# Patient Record
Sex: Male | Born: 1950
Health system: Southern US, Community
[De-identification: ages and names within clinical notes are randomized; demographics above are authoritative.]

## PROBLEM LIST (undated history)

## (undated) DIAGNOSIS — K4090 Unilateral inguinal hernia, without obstruction or gangrene, not specified as recurrent: Secondary | ICD-10-CM

## (undated) DIAGNOSIS — D213 Benign neoplasm of connective and other soft tissue of thorax: Secondary | ICD-10-CM

## (undated) DIAGNOSIS — I10 Essential (primary) hypertension: Secondary | ICD-10-CM

## (undated) DIAGNOSIS — L02219 Cutaneous abscess of trunk, unspecified: Secondary | ICD-10-CM

## (undated) DIAGNOSIS — K469 Unspecified abdominal hernia without obstruction or gangrene: Secondary | ICD-10-CM

## (undated) DIAGNOSIS — M109 Gout, unspecified: Secondary | ICD-10-CM

## (undated) DIAGNOSIS — C493 Malignant neoplasm of connective and soft tissue of thorax: Secondary | ICD-10-CM

## (undated) DIAGNOSIS — Z87891 Personal history of nicotine dependence: Secondary | ICD-10-CM

## (undated) DIAGNOSIS — Z1211 Encounter for screening for malignant neoplasm of colon: Secondary | ICD-10-CM

## (undated) DIAGNOSIS — I2510 Atherosclerotic heart disease of native coronary artery without angina pectoris: Secondary | ICD-10-CM

## (undated) DIAGNOSIS — L03319 Cellulitis of trunk, unspecified: Secondary | ICD-10-CM

## (undated) DIAGNOSIS — I499 Cardiac arrhythmia, unspecified: Secondary | ICD-10-CM

## (undated) DIAGNOSIS — H269 Unspecified cataract: Secondary | ICD-10-CM

## (undated) DIAGNOSIS — H9190 Unspecified hearing loss, unspecified ear: Secondary | ICD-10-CM

## (undated) DIAGNOSIS — H35342 Macular cyst, hole, or pseudohole, left eye: Secondary | ICD-10-CM

## (undated) DIAGNOSIS — D485 Neoplasm of uncertain behavior of skin: Secondary | ICD-10-CM

## (undated) DIAGNOSIS — C61 Malignant neoplasm of prostate: Secondary | ICD-10-CM

## (undated) DIAGNOSIS — S2239XA Fracture of one rib, unspecified side, initial encounter for closed fracture: Secondary | ICD-10-CM

## (undated) HISTORY — DX: Benign neoplasm of connective and other soft tissue of thorax: D21.3

## (undated) HISTORY — PX: LIPOMA EXCISION: SHX5283

## (undated) HISTORY — DX: Personal history of nicotine dependence: Z87.891

## (undated) HISTORY — DX: Encounter for screening for malignant neoplasm of colon: Z12.11

## (undated) HISTORY — DX: Fracture of one rib, unspecified side, initial encounter for closed fracture: S22.39XA

## (undated) HISTORY — DX: Essential (primary) hypertension: I10

## (undated) HISTORY — DX: Neoplasm of uncertain behavior of skin: D48.5

## (undated) HISTORY — DX: Gout, unspecified: M10.9

## (undated) HISTORY — DX: Unspecified cataract: H26.9

## (undated) HISTORY — DX: Malignant neoplasm of connective and soft tissue of thorax: C49.3

## (undated) HISTORY — DX: Cellulitis of trunk, unspecified: L03.319

## (undated) HISTORY — DX: Unspecified abdominal hernia without obstruction or gangrene: K46.9

## (undated) HISTORY — DX: Cutaneous abscess of trunk, unspecified: L02.219

## (undated) HISTORY — PX: EYE SURGERY: SHX253

## (undated) HISTORY — DX: Malignant neoplasm of prostate: C61

## (undated) HISTORY — PX: COLONOSCOPY: SHX174

## (undated) HISTORY — DX: Macular cyst, hole, or pseudohole, left eye: H35.342

---
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
## 2008-09-02 HISTORY — PX: INGUINAL HERNIA REPAIR: SUR1180

## 2008-10-14 ENCOUNTER — Ambulatory Visit: Payer: Self-pay | Admitting: General Surgery

## 2009-09-02 DIAGNOSIS — C61 Malignant neoplasm of prostate: Secondary | ICD-10-CM

## 2009-09-02 DIAGNOSIS — M109 Gout, unspecified: Secondary | ICD-10-CM

## 2009-09-02 HISTORY — DX: Gout, unspecified: M10.9

## 2009-09-02 HISTORY — DX: Malignant neoplasm of prostate: C61

## 2009-12-01 HISTORY — PX: PROSTATECTOMY: SHX69

## 2010-09-02 HISTORY — PX: THORACOTOMY: SUR1349

## 2010-09-02 HISTORY — PX: POLYPECTOMY: SHX149

## 2010-09-02 HISTORY — PX: AXILLARY SURGERY: SHX892

## 2010-10-03 ENCOUNTER — Ambulatory Visit: Payer: Self-pay | Admitting: Family Medicine

## 2010-10-29 ENCOUNTER — Ambulatory Visit: Payer: Self-pay | Admitting: General Surgery

## 2010-11-01 ENCOUNTER — Ambulatory Visit: Payer: Self-pay | Admitting: Cardiothoracic Surgery

## 2010-11-16 ENCOUNTER — Ambulatory Visit: Payer: Self-pay | Admitting: Anesthesiology

## 2010-12-02 ENCOUNTER — Ambulatory Visit: Payer: Self-pay | Admitting: Cardiothoracic Surgery

## 2010-12-12 ENCOUNTER — Inpatient Hospital Stay: Payer: Self-pay | Admitting: General Surgery

## 2010-12-12 DIAGNOSIS — C493 Malignant neoplasm of connective and soft tissue of thorax: Secondary | ICD-10-CM | POA: Insufficient documentation

## 2010-12-25 ENCOUNTER — Ambulatory Visit: Payer: Self-pay | Admitting: General Surgery

## 2011-02-01 ENCOUNTER — Ambulatory Visit: Payer: Self-pay | Admitting: General Surgery

## 2011-02-18 LAB — PATHOLOGY REPORT

## 2011-04-02 ENCOUNTER — Ambulatory Visit: Payer: Self-pay | Admitting: Unknown Physician Specialty

## 2011-04-05 LAB — PATHOLOGY REPORT

## 2011-07-15 ENCOUNTER — Encounter: Payer: Self-pay | Admitting: Family Medicine

## 2011-08-03 ENCOUNTER — Encounter: Payer: Self-pay | Admitting: Family Medicine

## 2011-09-03 ENCOUNTER — Encounter: Payer: Self-pay | Admitting: Family Medicine

## 2011-12-12 ENCOUNTER — Other Ambulatory Visit: Payer: Self-pay | Admitting: Radiology

## 2011-12-12 LAB — CREATININE, SERUM
Creatinine: 0.94 mg/dL (ref 0.60–1.30)
EGFR (African American): >60
EGFR (Non-African Amer.): >60

## 2011-12-13 ENCOUNTER — Ambulatory Visit: Payer: Self-pay | Admitting: General Surgery

## 2012-09-02 HISTORY — PX: INGUINAL HERNIA REPAIR: SUR1180

## 2012-11-06 ENCOUNTER — Encounter: Payer: Self-pay | Admitting: General Surgery

## 2012-11-23 ENCOUNTER — Ambulatory Visit (INDEPENDENT_AMBULATORY_CARE_PROVIDER_SITE_OTHER): Payer: 59 | Admitting: General Surgery

## 2012-11-23 ENCOUNTER — Encounter: Payer: Self-pay | Admitting: General Surgery

## 2012-11-23 VITALS — BP 102/58 | HR 70 | Resp 14 | Ht 69.0 in | Wt 192.0 lb

## 2012-11-23 DIAGNOSIS — K4090 Unilateral inguinal hernia, without obstruction or gangrene, not specified as recurrent: Secondary | ICD-10-CM

## 2012-11-23 NOTE — Progress Notes (Signed)
Patient ID: Jeff Rhodes., male   DOB: 1951/08/07, 62 y.o.   MRN: [id number]  Chief Complaint  Patient presents with  . Abdominal Pain    rlq    HPI  Abdominal Pain This is a new problem. The current episode started 1 to 4 weeks ago. The onset quality is gradual. The pain is located in the RLQ (DESCRIPTED AS A BULGE). The pain is at a severity of 1/10. The pain is mild. The quality of the pain is burning. Pertinent negatives include no constipation, diarrhea, fever, headaches, nausea or vomiting. Nothing aggravates the pain. The pain is relieved by nothing.    Past Medical History  Diagnosis Date  . Cellulitis and abscess of trunk   . Prostate cancer 2011  . Personal history of tobacco use, presenting hazards to health   . Gout 2011  . Unspecified essential hypertension   . Hernia   . Malignant neoplasm of connective and other soft tissue of thorax   . Neoplasm of uncertain behavior of skin   . Special screening for malignant neoplasms, colon   . Other benign neoplasm of connective and other soft tissue of thorax     Past Surgical History  Procedure Laterality Date  . Colonoscopy  2007 and 2012    2007 done in [place]; 2012 done by Dr. Mechele Collin  . Lipoma excision  2007 and 2012    right axilla/chest  . Inguinal hernia repair Left 2010  . Polypectomy  2012  . Prostatectomy  April 2011  . Thoracotomy  2012  . 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 The patient suffered divison of the long thoracic nreve during resection, resulting in a winged scapula.    No family history on file.  Social History History  Substance Use Topics  . Smoking status: Former Smoker -- 1.00 packs/day for 40 years    Quit date: 09/02/2005  . Smokeless tobacco: Never Used  . Alcohol Use: .5 - 1 oz/week    1-2 drink(s) per week    No Known Allergies  Current Outpatient Prescriptions  Medication Sig Dispense Refill  .  allopurinol (ZYLOPRIM) 300 MG tablet Take 300 mg by mouth daily.      . Tadalafil (CIALIS PO) Take by mouth.       No current facility-administered medications for this visit.    Review of Systems Review of Systems  Constitutional: Negative for fever.  Gastrointestinal: Positive for abdominal pain. Negative for nausea, vomiting, diarrhea and constipation.  Neurological: Negative for headaches.    Blood pressure 102/58, pulse 70, resp. rate 14, height 5\' 9"  (1.753 m), weight 192 lb (87.091 kg).  Physical Exam Physical Exam  Constitutional: He appears well-developed and well-nourished.  Neck: Normal range of motion.  Cardiovascular: Normal rate, regular rhythm, normal heart sounds, intact distal pulses and normal pulses.   Pulmonary/Chest: Breath sounds normal.  Abdominal: Soft. Normal appearance and bowel sounds are normal. There is no hepatosplenomegaly. There is no tenderness. A hernia is present. Hernia confirmed positive in the right inguinal area.  Genitourinary: Testes normal.   Careful examination of the right axilla did not show evidence of recurrent liposarcoma. The patient shows full range of motion. A winged scapula is again evident although less prominent than in the immediate postoperative period. No supraclavicular adenopathy is appreciated  Data Reviewed None  Assessment    Right inguinal hernia hernia    Plan    Elective right inguinal hernia  repair with prosthetic mesh.       Earline Mayotte 11/26/2012, 9:29 PM

## 2012-11-23 NOTE — Patient Instructions (Signed)
Patient's surgery has been scheduled for 11-30-12 at [facility].

## 2012-11-25 ENCOUNTER — Ambulatory Visit: Payer: Self-pay | Admitting: General Surgery

## 2012-11-25 ENCOUNTER — Encounter: Payer: Self-pay | Admitting: General Surgery

## 2012-11-26 ENCOUNTER — Other Ambulatory Visit: Payer: Self-pay | Admitting: General Surgery

## 2012-11-26 DIAGNOSIS — K4090 Unilateral inguinal hernia, without obstruction or gangrene, not specified as recurrent: Secondary | ICD-10-CM

## 2012-11-30 ENCOUNTER — Ambulatory Visit: Payer: Self-pay | Admitting: General Surgery

## 2012-11-30 DIAGNOSIS — K4090 Unilateral inguinal hernia, without obstruction or gangrene, not specified as recurrent: Secondary | ICD-10-CM

## 2012-12-01 ENCOUNTER — Encounter: Payer: Self-pay | Admitting: General Surgery

## 2012-12-02 ENCOUNTER — Telehealth: Payer: Self-pay | Admitting: *Deleted

## 2012-12-02 NOTE — Telephone Encounter (Signed)
Pt called answering service to ask for refill on Oxycodone.  I called pt back and talked about him using more Aleve and a heating pad during the day to see if that would help.  States he is taking 2 pills every 4 hours. Discussed avoiding constipation. He states he has 6-7 pills left and didn't want to run out over weekend.  Pt agrees to try heating pad and Aleve and will call me back Thursday afternoon with a status update.

## 2012-12-03 NOTE — Telephone Encounter (Signed)
I callled the pt and he states he is improving.  Bowels moved today and he has slept more but over all he feels he is improving

## 2012-12-09 ENCOUNTER — Ambulatory Visit (INDEPENDENT_AMBULATORY_CARE_PROVIDER_SITE_OTHER): Payer: 59 | Admitting: General Surgery

## 2012-12-09 ENCOUNTER — Encounter: Payer: Self-pay | Admitting: General Surgery

## 2012-12-09 VITALS — BP 110/62 | HR 60 | Resp 12 | Ht 69.0 in | Wt 163.0 lb

## 2012-12-09 DIAGNOSIS — K4090 Unilateral inguinal hernia, without obstruction or gangrene, not specified as recurrent: Secondary | ICD-10-CM

## 2012-12-09 DIAGNOSIS — C493 Malignant neoplasm of connective and soft tissue of thorax: Secondary | ICD-10-CM

## 2012-12-09 NOTE — Patient Instructions (Addendum)
Patient advised to cancel chest x-ray and have CT scheduled instead. He is also advised to continue use of heat in the surgical area to reduce swelling.   Patient may resume activities as tolerated, taking care to use proper technique when lifting (demonstrated).

## 2012-12-09 NOTE — Progress Notes (Signed)
Patient ID: Jeff Alldredge., male   DOB: 09-Nov-1950, 62 y.o.   MRN: [id number]  Chief Complaint  Patient presents with  . Routine Post Op    right open inguinal hernia    HPI Jeff Ostrow. is a 62 y.o. male who presents for a post op right open inguinal hernia repair. The repair was done 11/30/12 at [facility]. He states he is doing well and is no longer taking his pain  Medication. He is still having some minor pain but nothing he wasn't expecting.  HPI  Past Medical History  Diagnosis Date  . Cellulitis and abscess of trunk   . Prostate cancer 2011  . Personal history of tobacco use, presenting hazards to health   . Gout 2011  . Unspecified essential hypertension   . Hernia   . Malignant neoplasm of connective and other soft tissue of thorax   . Neoplasm of uncertain behavior of skin   . Special screening for malignant neoplasms, colon   . Other benign neoplasm of connective and other soft tissue of thorax     Past Surgical History  Procedure Laterality Date  . Colonoscopy  2007 and 2012    2007 done in [place]; 2012 done by Dr. Mechele Collin  . Lipoma excision  2007 and 2012    right axilla/chest  . Polypectomy  2012  . Prostatectomy  April 2011  . Thoracotomy  2012  . 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 The patient suffered divison of the long thoracic nreve during resection, resulting in a winged scapula.  . Inguinal hernia repair Left 2010  . Inguinal hernia repair Right 2014    History reviewed. No pertinent family history.  Social History History  Substance Use Topics  . Smoking status: Former Smoker -- 1.00 packs/day for 40 years    Quit date: 09/02/2005  . Smokeless tobacco: Never Used  . Alcohol Use: .5 - 1 oz/week    1-2 drink(s) per week    No Known Allergies  Current Outpatient Prescriptions  Medication Sig Dispense Refill  . allopurinol (ZYLOPRIM) 300 MG tablet Take 300 mg by mouth  daily.      . Tadalafil (CIALIS PO) Take by mouth.      . terbinafine (LAMISIL) 250 MG tablet Take 1 tablet by mouth daily.       No current facility-administered medications for this visit.    Review of Systems Review of Systems  Constitutional: Negative.   Respiratory: Negative.   Cardiovascular: Negative.   Gastrointestinal: Negative.     Blood pressure 110/62, pulse 60, resp. rate 12, height 5\' 9"  (1.753 m), weight 163 lb (73.936 kg).  Physical Exam Physical Exam  Constitutional: He appears well-developed and well-nourished.  Abdominal: Soft. Normal appearance and bowel sounds are normal. There is no tenderness. No hernia.    Data Reviewed Chest x-ray obtained prior to hernia surgery showed no intrathoracic disease. Assessment    Doing well status post right inguinal hernia repair with Ultra Pro mesh.  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    Plan    The patient will increase his activity as tolerated.  Arrangements were made for a follow up CT of the chest and axilla to assess for recurrent disease based on previous [facility] tumor recommendations and the patient reported mild fullness.       Jeff Rhodes 12/10/2012, 7:43 PM

## 2012-12-10 ENCOUNTER — Encounter: Payer: Self-pay | Admitting: General Surgery

## 2012-12-10 DIAGNOSIS — K4090 Unilateral inguinal hernia, without obstruction or gangrene, not specified as recurrent: Secondary | ICD-10-CM | POA: Insufficient documentation

## 2012-12-10 HISTORY — DX: Unilateral inguinal hernia, without obstruction or gangrene, not specified as recurrent: K40.90

## 2012-12-17 ENCOUNTER — Telehealth: Payer: Self-pay | Admitting: *Deleted

## 2012-12-17 NOTE — Telephone Encounter (Signed)
We finally received approval through patient's insurance company for CT scan of the chest with contrast. This has been rescheduled to 12-22-12 at [facility] for 12-22-12 at 8 am (arrive 7:45 am). He is aware to take medication list and aware there is no prep. Patient has also been scheduled for an office visit follow up.

## 2012-12-21 ENCOUNTER — Ambulatory Visit: Payer: Self-pay | Admitting: General Surgery

## 2012-12-22 ENCOUNTER — Encounter: Payer: Self-pay | Admitting: General Surgery

## 2012-12-22 ENCOUNTER — Ambulatory Visit: Payer: Self-pay | Admitting: General Surgery

## 2012-12-23 NOTE — Progress Notes (Signed)
Quick Note:  The CT of the chest completed now 2 years after excision of his low-grade liposarcoma from the right axilla shows no evidence of recurrent disease. Less postsurgical changes identified compared to last years exam.  A 10 mm spiculated lesion in the lower lobe the left side is unchanged since 2012.  The patient has been notified of the results. Will review in detail with him at his followup in May 2014.   ______

## 2013-01-18 ENCOUNTER — Ambulatory Visit: Payer: 59 | Admitting: General Surgery

## 2013-02-01 ENCOUNTER — Ambulatory Visit: Payer: 59 | Admitting: General Surgery

## 2013-02-01 IMAGING — CT CT CHEST W/ CM
1 series · 14 of 31 positions shown, 18 images · non-contrast
Comparison: none

REASON FOR EXAM: right axillary and supraclavicular mass
COMMENTS:

[Series 2: soft tissue · axial · 0.89mm/px · z∈[-714,-340]mm · 14 of 89 slices shown, 18 images]
[im 7/89  mediastinal]
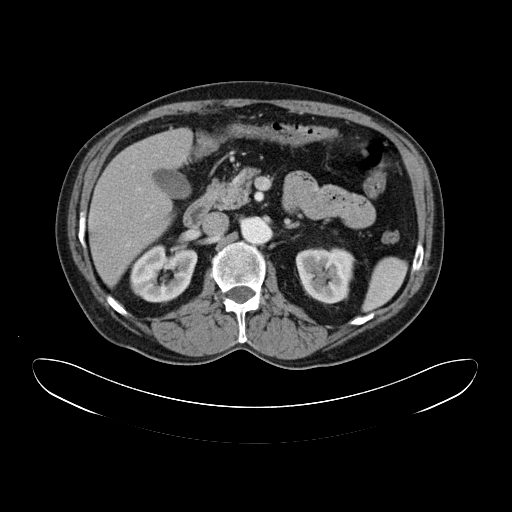
[im 7/89  lung]
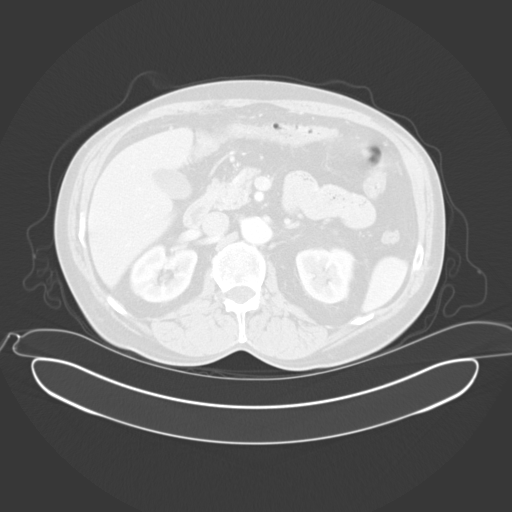
[im 14/89  lung]
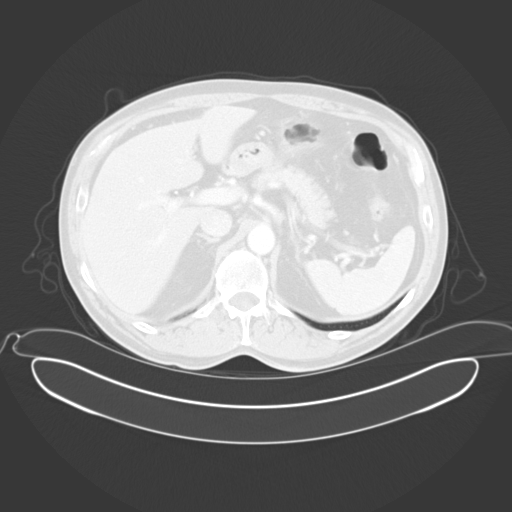
[im 18/89  lung]
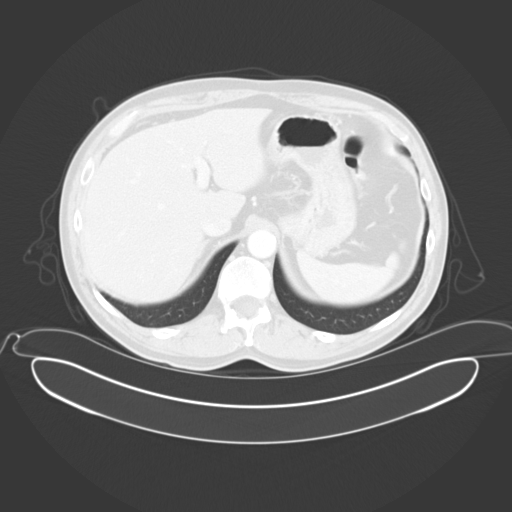
[im 23/89  lung]
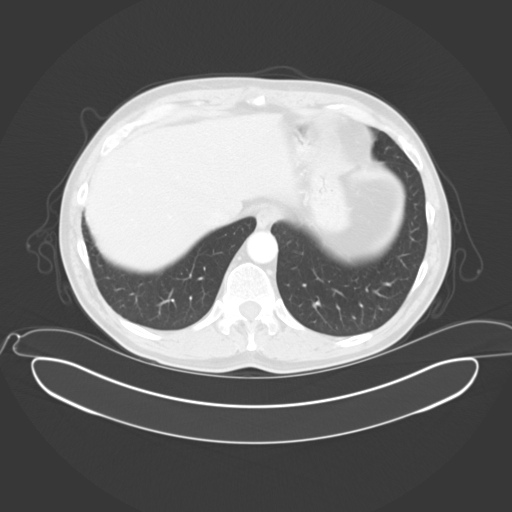
[im 30/89  mediastinal]
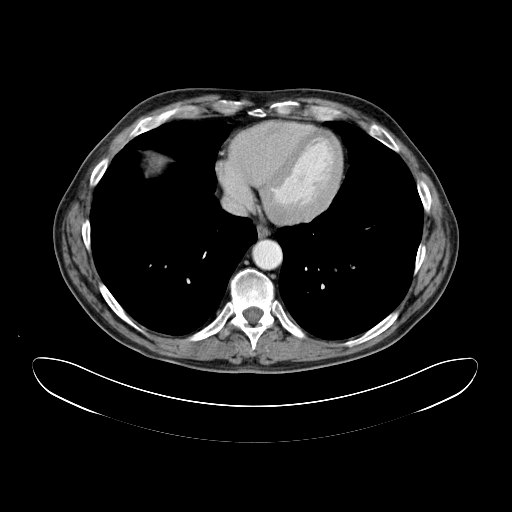
[im 30/89  lung]
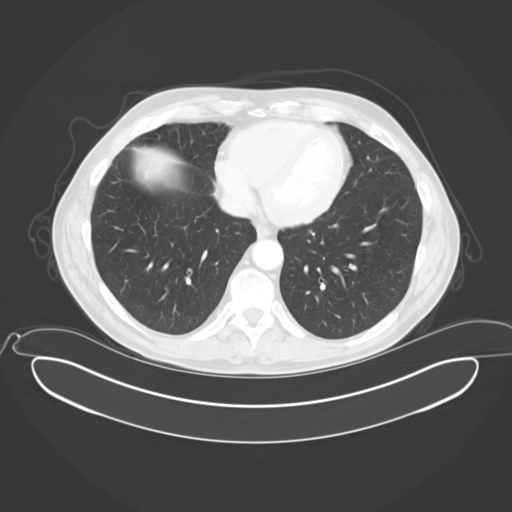
[im 36/89  lung]
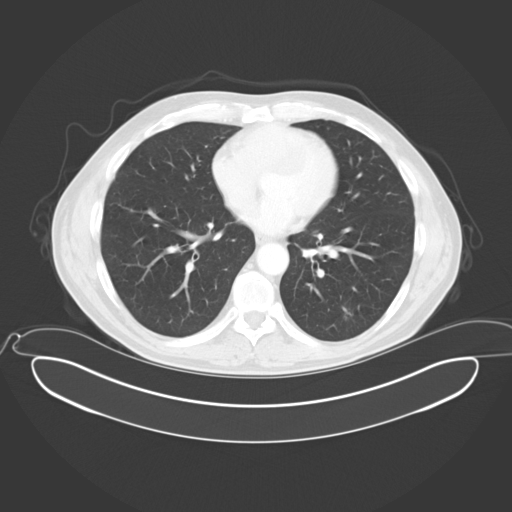
[im 43/89  lung]
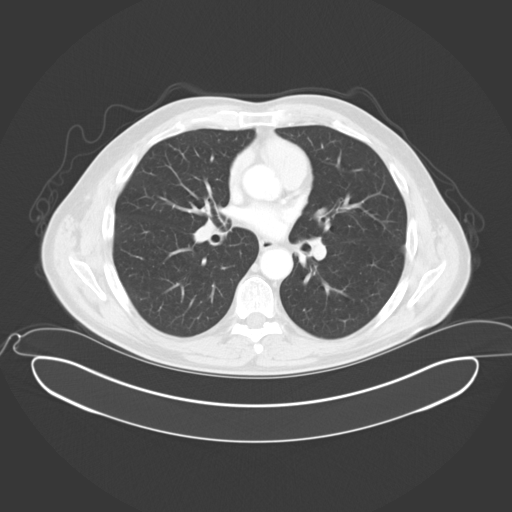
[im 47/89  lung]
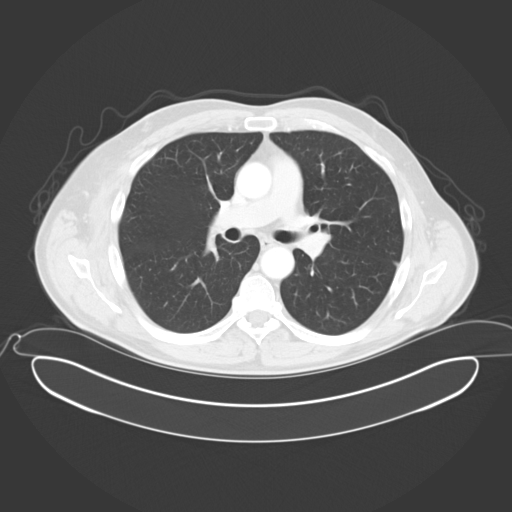
[im 53/89  mediastinal]
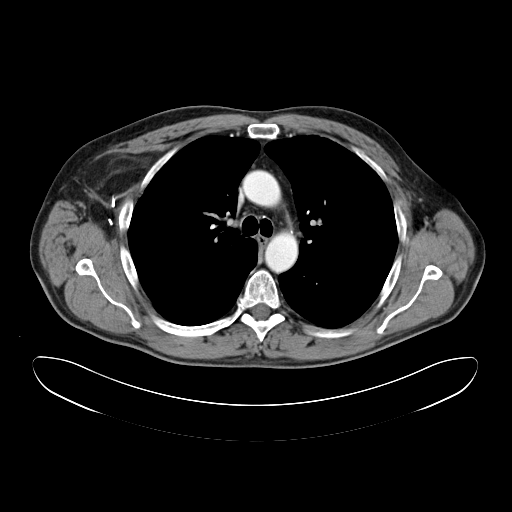
[im 53/89  lung]
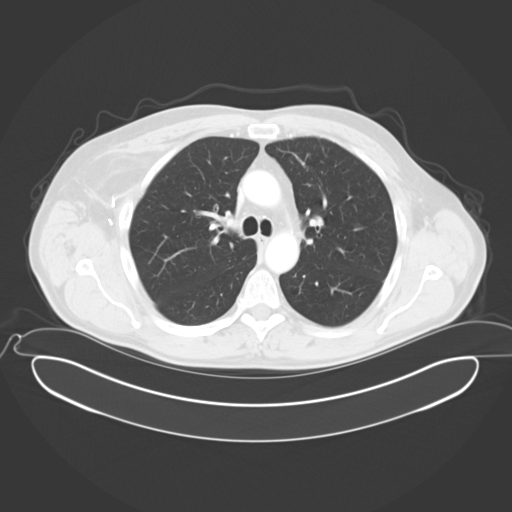
[im 59/89  lung]
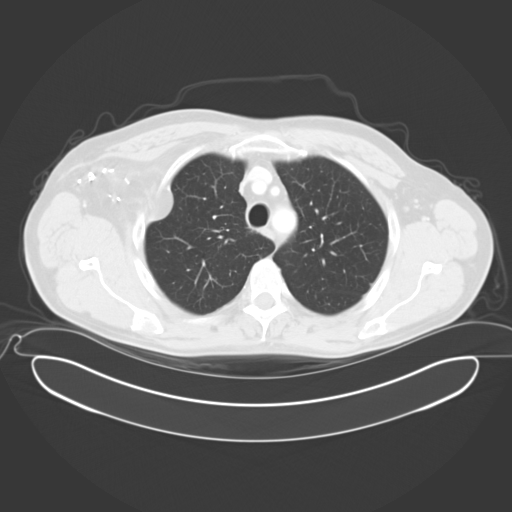
[im 66/89  lung]
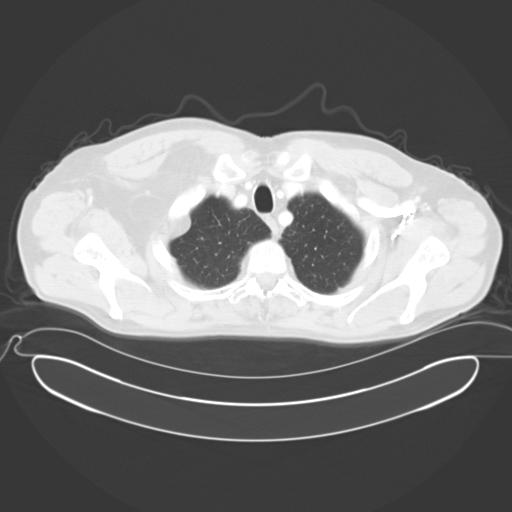
[im 71/89  lung]
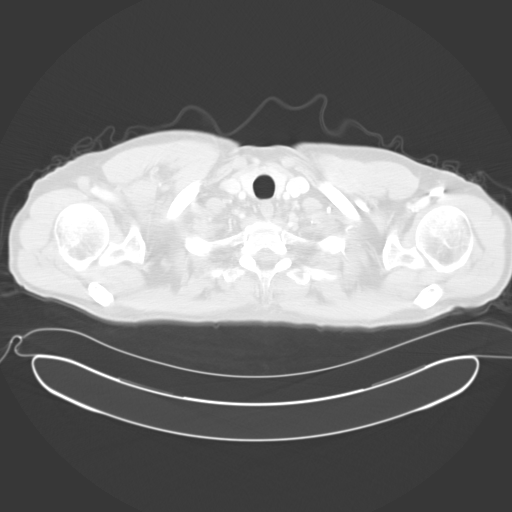
[im 75/89  mediastinal]
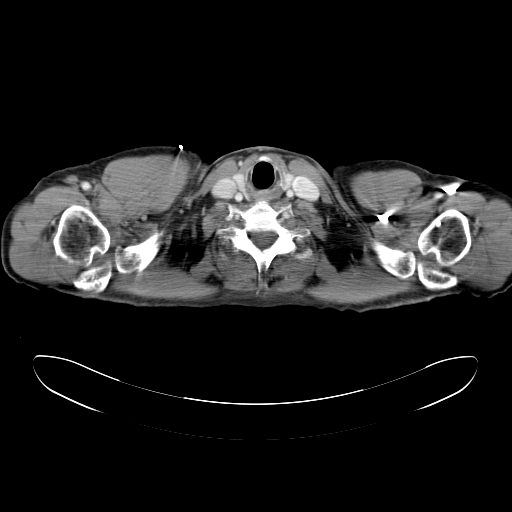
[im 75/89  lung]
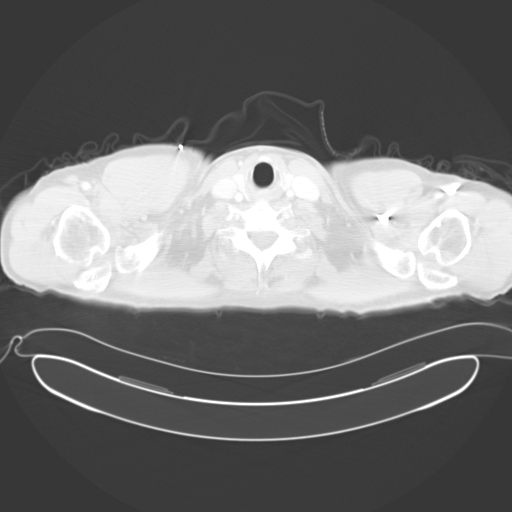
[im 82/89  lung]
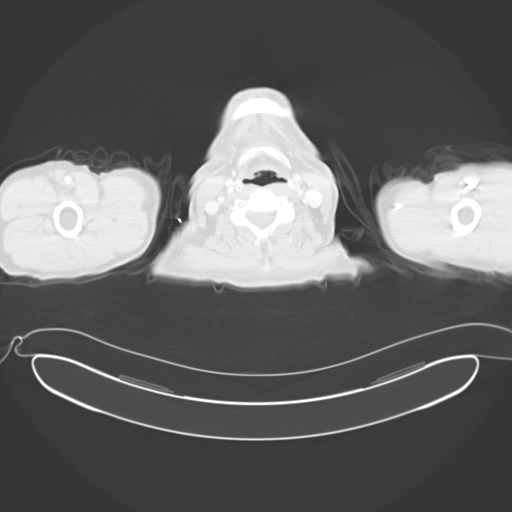

[14 of 31 positions shown; findings below may reference images not displayed]

PROCEDURE:     CT  - CT CHEST WITH CONTRAST  - October 29, 2010  [DATE]

RESULT:     Axial CT scanning was performed through the chest at 5 mm
intervals and slice thicknesses. The patient received 75 cc of [id number].

There is an abnormal appearance of the subpectoral fat on the right. This
extends into the right axillary region. There are numerous surgical clips
here. There is generalized increased density within the normally hypodense
fat and there are prominent septations visible. There is extension of
abnormal soft tissue density into the pleural space anterolaterally at the
level of the right first and second ribs. I do not see erosive changes of
the ribs. On the left I do not see similar findings.

A marker has been placed over the medial right pectoral region superiorly.
Deep to this the pectoralis muscle is normal in density but appears to be
better developed than on the left. The subcutaneous fat is normal in density
and thickness.

Within the thorax there is the aforementioned extension of fatty and soft
tissue density material into the upper right hemithorax . This intrathoracic
extension measures approximately 2 cm transversely by 4 cm AP. I see no
mediastinal nor hilar lymphadenopathy. The cardiac chambers are normal in
size. The caliber of the thoracic aorta is normal. There is no pleural nor
pericardial effusion.

At lung window settings there is subtle nodularity in the left lower lobe
measuring less than 1 cm in diameter which is nonspecific. There are even
tinier areas of subpleural nodularity in both upper lobes seen on image 23.
These are nonspecific.

Within the upper abdomen the observed portions of the liver are normal.
There are no adrenal masses. The observed portions of the spleen and
gallbladder appear normal.
IMPRESSION: 1. There is abnormal soft tissue density which is predominantly fatty in the
the axillary region and lateral supraclavicular region on the right. There
are numerous surgical clips present. There is intrathoracic extension.
Correlation with clinical and laboratory values is needed. Presumably the
patient has undergone biopsy. Certainly malignancy such as liposarcoma
cannot be excluded .
2. I see no intrathoracic lymphadenopathy.
3. Nonspecific subpleural patchy densities are noted in both lungs . These
are not diagnostic of metastatic disease and will merit followup.

## 2013-03-01 ENCOUNTER — Encounter: Payer: Self-pay | Admitting: General Surgery

## 2013-03-01 ENCOUNTER — Ambulatory Visit (INDEPENDENT_AMBULATORY_CARE_PROVIDER_SITE_OTHER): Payer: 59 | Admitting: General Surgery

## 2013-03-01 VITALS — BP 110/60 | HR 76 | Resp 14 | Ht 70.0 in | Wt 160.0 lb

## 2013-03-01 DIAGNOSIS — C493 Malignant neoplasm of connective and soft tissue of thorax: Secondary | ICD-10-CM

## 2013-03-01 DIAGNOSIS — D1720 Benign lipomatous neoplasm of skin and subcutaneous tissue of unspecified limb: Secondary | ICD-10-CM

## 2013-03-01 NOTE — Progress Notes (Signed)
Patient ID: Jeff Rhodes., male   DOB: November 02, 1950, 62 y.o.   MRN: [id number]  Chief Complaint  Patient presents with  . Follow-up    CT chest    HPI Jeff Rhodes. is a 62 y.o. male for follow up of CT done of the chest at [facility] on 12/22/12. Patient has history of lipoma excision from right axilla in 2007 and again in 2012. Patient has no complaints at this time. HPI  Past Medical History  Diagnosis Date  . Cellulitis and abscess of trunk   . Prostate cancer 2011  . Personal history of tobacco use, presenting hazards to health   . Gout 2011  . Unspecified essential hypertension   . Hernia   . Malignant neoplasm of connective and other soft tissue of thorax   . Neoplasm of uncertain behavior of skin   . Special screening for malignant neoplasms, colon   . Other benign neoplasm of connective and other soft tissue of thorax     Past Surgical History  Procedure Laterality Date  . Colonoscopy  2007 and 2012    2007 done in [place]; 2012 done by Dr. Mechele Collin  . Lipoma excision  2007 and 2012    right axilla/chest  . Polypectomy  2012  . Prostatectomy  April 2011  . Thoracotomy  2012  . 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 The patient suffered divison of the long thoracic nreve during resection, resulting in a winged scapula.  . Inguinal hernia repair Left 2010  . Inguinal hernia repair Right 2014    No family history on file.  Social History History  Substance Use Topics  . Smoking status: Former Smoker -- 1.00 packs/day for 40 years    Quit date: 09/02/2005  . Smokeless tobacco: Never Used  . Alcohol Use: .5 - 1 oz/week    1-2 drink(s) per week    No Known Allergies  Current Outpatient Prescriptions  Medication Sig Dispense Refill  . allopurinol (ZYLOPRIM) 300 MG tablet Take 300 mg by mouth daily.      . Tadalafil (CIALIS PO) Take by mouth.      . terbinafine (LAMISIL) 250 MG tablet Take  1 tablet by mouth daily.       No current facility-administered medications for this visit.    Review of Systems Review of Systems  Constitutional: Negative.   Respiratory: Negative.   Cardiovascular: Negative.     There were no vitals taken for this visit.  Physical Exam Physical Exam  Constitutional: He is oriented to person, place, and time. He appears well-developed and well-nourished.  Neck: Neck supple.  Cardiovascular: Normal rate, regular rhythm and normal heart sounds.   Pulmonary/Chest: Effort normal and breath sounds normal.  Lymphadenopathy:    He has no cervical adenopathy.    He has no axillary adenopathy.  Right axillary seroma  Neurological: He is alert and oriented to person, place, and time.  Skin:  Well healed incision of right axilla  Winging of the right scapula is again appreciated.    Data Reviewed CT dated December 22, 2012 reported no evidence of recurrent or residual right axillary malignancy. A spiculated 10 mm left lower lobe lung nodules unchanged from February 2012. Coronary artery disease identified. On my review of the films there was a suggestion of a small seroma in the right axilla not previously appreciated. There is no clear muscle atrophy of the serratus muscle appreciated on review of  the CT.  Ultrasound examination of the right axilla showed a small seroma cavity measuring 1 x 1 x 1.6 cm. Smooth borders and excellent acoustic enhancement was noted. The axillary  vein and artery are unremarkable. Assessment    No evidence of recurrent liposarcoma involving the right axilla.     Plan    We'll arrange for a followup exam with a repeat chest CT in one year. This will be to both evaluate the right axilla as well as the stable spiculated left lower lobe lung nodule.        Jeff Rhodes M 03/01/2013, 3:25 PM

## 2013-03-01 NOTE — Patient Instructions (Addendum)
Follow up in one year with chest CT.

## 2013-03-30 IMAGING — CR DG CHEST 2V
1 series · 2 of 2 positions shown · non-contrast
Comparison: none

REASON FOR EXAM: P/O thoracotomy
COMMENTS:

[Series 1: view not recorded · 0.17mm/px · 2 of 2 slices shown]
[im 1/2]
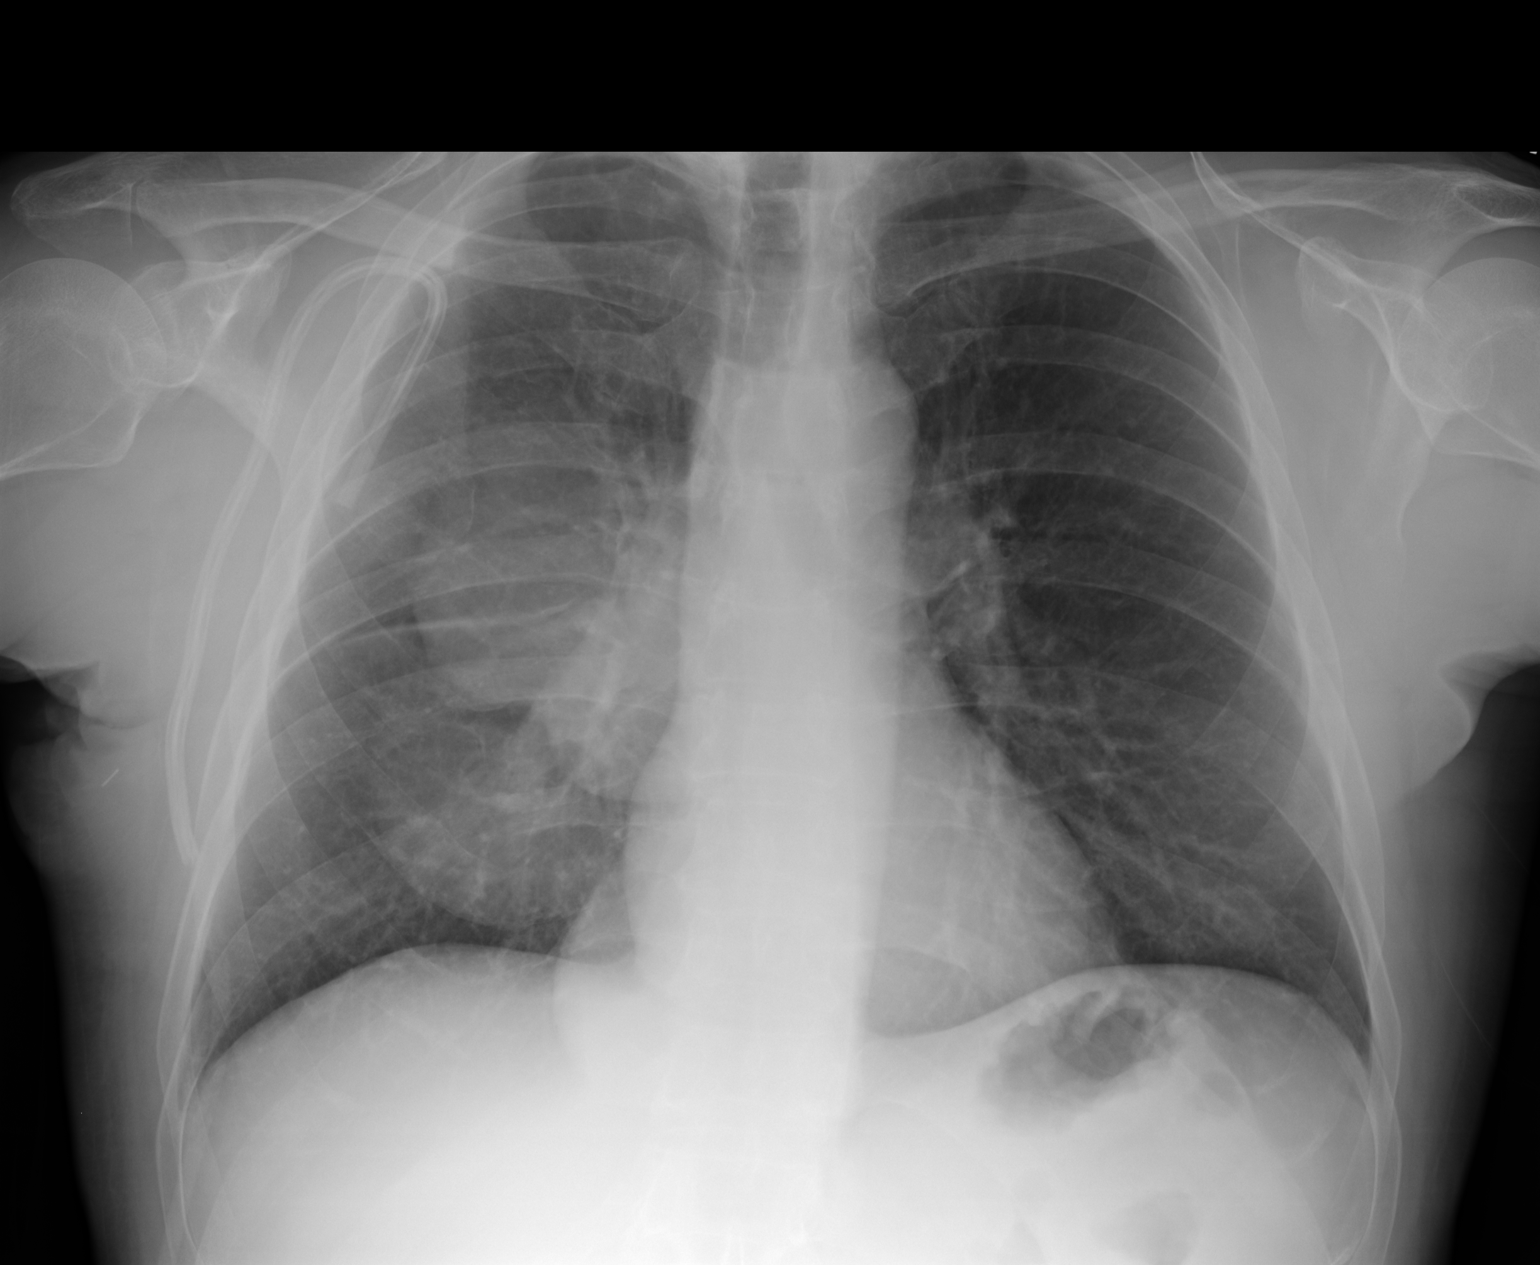
[im 2/2]
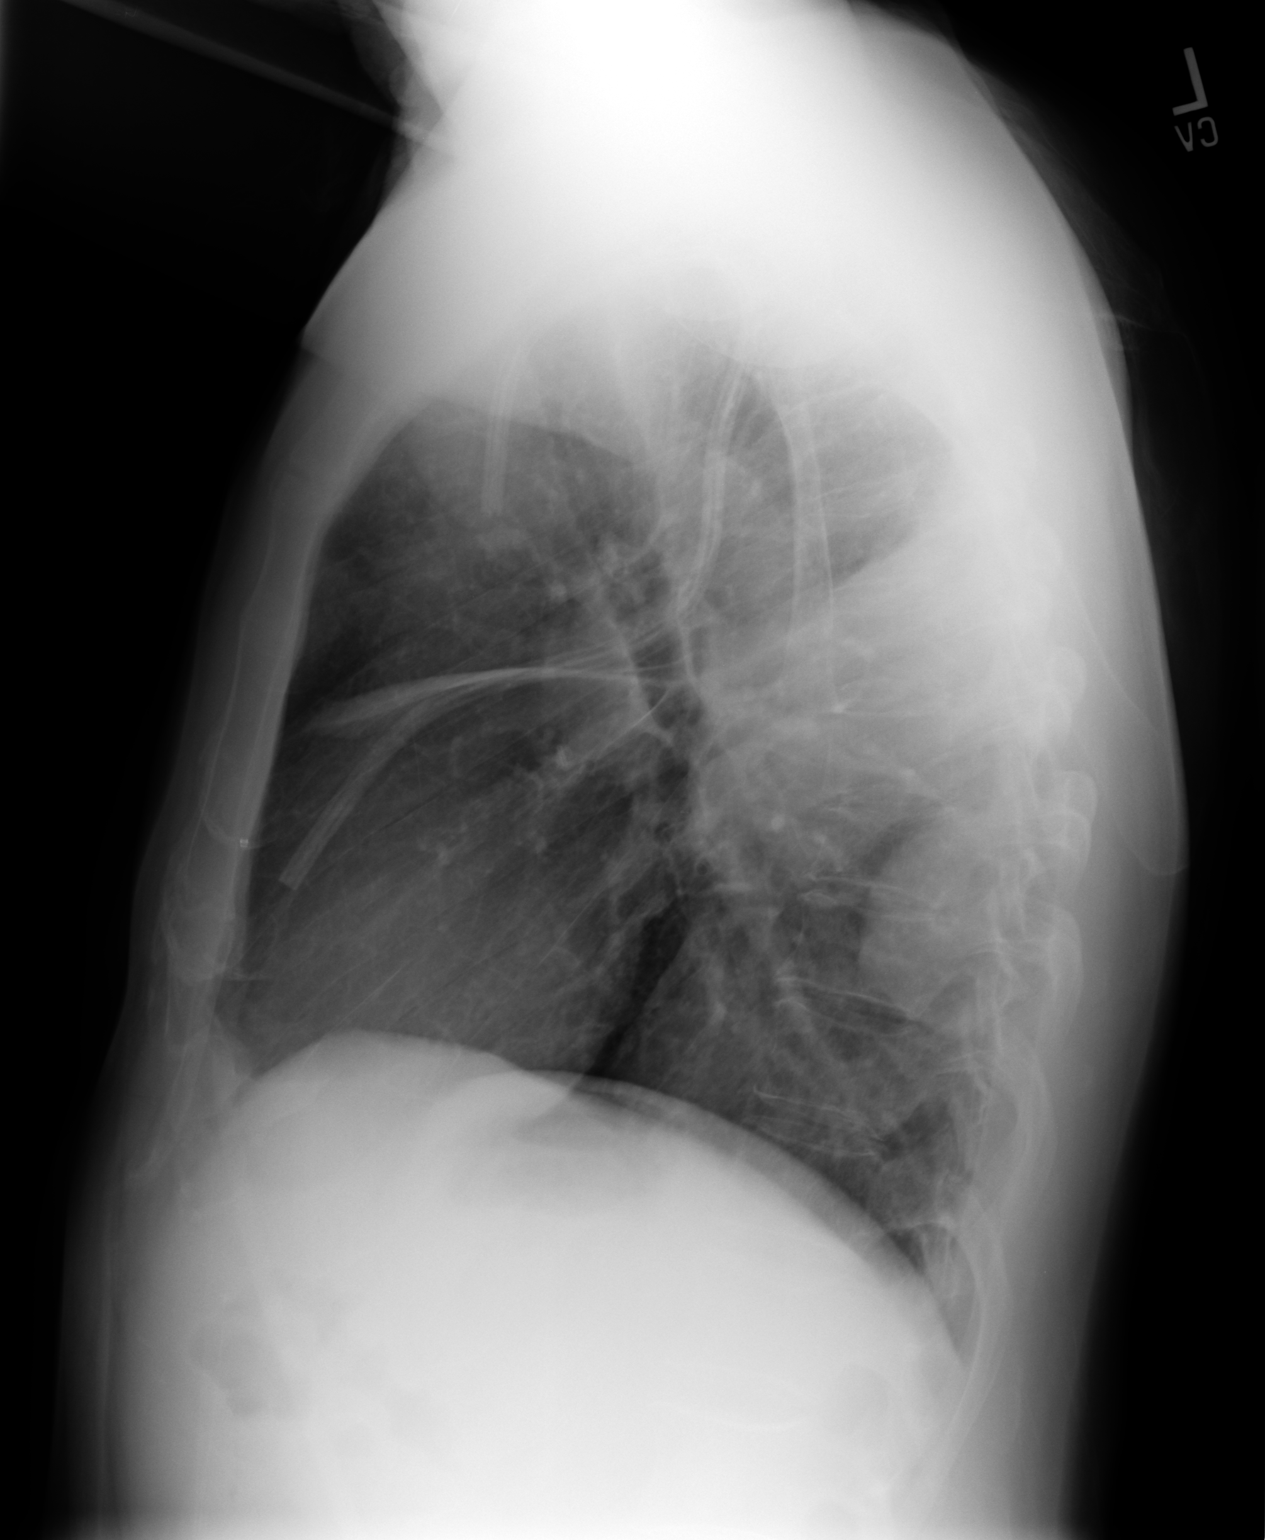

[2 of 2 positions shown; findings below may reference images not displayed]

PROCEDURE:     KDR - KDXR CHEST PA (OR AP) AND LAT  - December 25, 2010  [DATE]

RESULT:     Comparison is made to the study 18 December, 2010.

There is increased soft tissue density posteriorly in the right hemithorax
consistent with pleural fluid collections. The tip of the small caliber
chest tube appears to lie fairly anteriorly on the lateral film. The left
lung is clear with no evidence of pleural fluid. The cardiac silhouette is
normal in size.
IMPRESSION: There is likely loculated fluid posteriorly in the right
hemithorax. The findings here have worsened since the previous study.

## 2013-07-08 ENCOUNTER — Other Ambulatory Visit: Payer: Self-pay

## 2013-09-20 ENCOUNTER — Ambulatory Visit: Payer: Self-pay | Admitting: Family Medicine

## 2013-09-25 ENCOUNTER — Ambulatory Visit: Payer: Self-pay | Admitting: Family Medicine

## 2013-09-27 ENCOUNTER — Ambulatory Visit: Payer: Self-pay | Admitting: Family Medicine

## 2013-11-01 ENCOUNTER — Ambulatory Visit: Payer: Self-pay | Admitting: Internal Medicine

## 2013-12-27 ENCOUNTER — Ambulatory Visit: Payer: 59 | Admitting: General Surgery

## 2014-02-07 ENCOUNTER — Ambulatory Visit (INDEPENDENT_AMBULATORY_CARE_PROVIDER_SITE_OTHER): Payer: 59 | Admitting: General Surgery

## 2014-02-07 ENCOUNTER — Encounter: Payer: Self-pay | Admitting: General Surgery

## 2014-02-07 VITALS — BP 98/60 | HR 66 | Resp 12 | Ht 70.0 in | Wt 159.0 lb

## 2014-02-07 DIAGNOSIS — C493 Malignant neoplasm of connective and soft tissue of thorax: Secondary | ICD-10-CM

## 2014-02-07 NOTE — Patient Instructions (Signed)
Patient to return as needed. Patient to call us with any questions.

## 2014-02-07 NOTE — Progress Notes (Signed)
Patient ID: Jeff Asper., male   DOB: 05-08-1951, 63 y.o.   MRN: [id number]  Chief Complaint  Patient presents with  . Follow-up    1 year follow up chest CT    HPI Jeff Rhodes. is a 63 y.o. male here today for his one year follow up chest Ct done on 09/25/13. Patient has history of lipoma excision from right axilla in 2007 and again in 2012. Patient has no complaints at this time, except those related to division of the long thoracic nerve at the time of his 2012 surgery resulting in an "winged scapula".  HPI  Past Medical History  Diagnosis Date  . Cellulitis and abscess of trunk   . Prostate cancer 2011  . Personal history of tobacco use, presenting hazards to health   . Gout 2011  . Unspecified essential hypertension   . Hernia   . Malignant neoplasm of connective and other soft tissue of thorax   . Neoplasm of uncertain behavior of skin   . Special screening for malignant neoplasms, colon   . Other benign neoplasm of connective and other soft tissue of thorax     Past Surgical History  Procedure Laterality Date  . Colonoscopy  2007 and 2012    2007 done in [place]; 2012 done by Dr. Vira Agar  . Lipoma excision  2007 and 2012    right axilla/chest  . Polypectomy  2012  . Prostatectomy  April 2011  . Thoracotomy  2012  . 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 The patient suffered divison of the long thoracic nreve during resection, resulting in a winged scapula.  . Inguinal hernia repair Left 2010  . Inguinal hernia repair Right 2014    No family history on file.  Social History History  Substance Use Topics  . Smoking status: Former Smoker -- 1.00 packs/day for 40 years    Quit date: 09/02/2005  . Smokeless tobacco: Never Used  . Alcohol Use: 0.5 - 1.0 oz/week    1-2 drink(s) per week    No Known Allergies  Current Outpatient Prescriptions  Medication Sig Dispense Refill  . allopurinol  (ZYLOPRIM) 300 MG tablet Take 300 mg by mouth daily.      . chlorhexidine (PERIDEX) 0.12 % solution 1 mL by Mouth Rinse route daily.      . Tadalafil (CIALIS PO) Take by mouth.      . terbinafine (LAMISIL) 250 MG tablet Take 1 tablet by mouth daily.       No current facility-administered medications for this visit.    Review of Systems Review of Systems  Constitutional: Negative.   Respiratory: Negative.   Cardiovascular: Negative.     Blood pressure 98/60, pulse 66, resp. rate 12, height 5\' 10"  (1.778 m), weight 159 lb (72.122 kg).  Physical Exam Physical Exam  Constitutional: He is oriented to person, place, and time. He appears well-developed and well-nourished.  Eyes: Conjunctivae are normal. No scleral icterus.  Neck: Neck supple.  Cardiovascular: Normal rate, regular rhythm and normal heart sounds.   Pulmonary/Chest: Effort normal and breath sounds normal.     Abdominal: Soft. Normal appearance and bowel sounds are normal. There is no hepatomegaly. There is no tenderness.  Lymphadenopathy:    He has no cervical adenopathy.  Neurological: He is alert and oriented to person, place, and time.  Skin: Skin is warm and dry.    Data Reviewed PATHOLOGY REPORT  Pathology Report .                               [  Final Report         ]                   Material submitted:                                             Marland Kitchen PART A: RIGHT AXILLARY MASS PART B: FIRST RIGHT INTERSPACE MASS PART C: SECOND RIGHT INTERSPACE MASS .                               [   Final Report         ]                   Clinician provided ICD-9: 214.9 ; Lipoma of unspecified site .                               [   Final Report         ]                   Pre-operative diagnosis:                                        . RIGHT AXILLARY LIPOMA EXCISION RIGHT AXILLARY MASS, THORACOTOMY .                               [   Final Report         ]                    Diagnosis: CONSULTATION DIAGNOSIS  (ACCESSION #[id number]) (12-21-10): A.  RIGHT AXILLARY MASS: - ATYPICAL LIPOMATOUS NEOPLASM (WELL DIFFERENTIATED LIPOMA-LIKE LIPOSARCOMA/ATYPICAL LIPOMA). . B.  FIRST RIGHT INTERSPACE MASS: - INTRAMUSCULAR LIPOMA WITH FOCAL MYXOID CHANGES. . C.  SECOND RIGHT INTERSPACE MASS: - INTRAMUSCULAR LIPOMA. Marland Kitchen COMMENT:      See report from Dr. Arville Care, [facility], [place], [place].  Report will be forwarded under separate cover. . . ASB/12/28/2010  .                               [   Final Report         ]                   Addendum:                                                       . CONSULTATION DIAGNOSIS (ACCESSION #[id number]) (02-12-11): IT IS DIFFICULT TO EVALUATE SURGICAL MARGINS IN A LOW GRADE LIPOSARCOMA INVOLVING PRE-EXISTING CONNECTIVE TISSUE.  HOWEVER, BASED ON MY EVALUATION AND THE GROSS DESCRIPTION, I BELIEVE TUMOR INVOLVES THE MEDIAL MARGIN AND PROBABLY INVOLVES BOTH ANTERIOR AND DEEP MARGINS AS WELL.  AT LEAST CLOSE CLINICAL FOLLOW-UP IS RECOMMENDED. ASB/02/13/2011 Addendum Electronically Signed by Inda Castle,  M.D., Pathologist .                               [   Final Report         ]                   Electronically signed:                                          . Sena Slate, MD, Pathologist .                               [   Final Report         ]                    B. First right interspace mass: Received in a formalin filled container labeled Jeff Rhodes are two fragments of unoriented pale yellow fatty tissue measuring 4.5 x 4.3 x 1.5 cm in greatest dimensions. There are no orienting sutures. The resected margins are inked black. The cut surface is pale yellow homogeneous and glistening and is slightly firm upon palpation, however no discrete lesions are grossly noted. Representative sections are submitted in five cassettes designated B1-B5. . C. Second right interspace mass: Received in a formalin  filled container labeled Jeff Rhodes are four pieces of pale yellow fatty tissue measuring 3.5 x 3.5 x 1.8 cm in aggregate. There are no orienting sutures. The resected margins are inked black. The cut surface is pale yellow homogeneous and glistening. Representative sections are submitted in four cassettes designated: C1-C4. BMD/KCT  Assessment    No evidence of recurrent low-grade liposarcoma of the right axilla.     Plan    The patient was asked to call if he appreciates any changes of concern. No further imaging studies are indicated unless a change in clinical exam is noted.      PCP: Seth Bake Royanne Warshaw 02/07/2014, 9:17 PM

## 2014-02-08 ENCOUNTER — Encounter: Payer: Self-pay | Admitting: General Surgery

## 2014-02-08 ENCOUNTER — Ambulatory Visit: Payer: Self-pay | Admitting: Family Medicine

## 2014-02-24 ENCOUNTER — Encounter: Payer: Self-pay | Admitting: General Surgery

## 2014-10-07 DIAGNOSIS — Z8546 Personal history of malignant neoplasm of prostate: Secondary | ICD-10-CM | POA: Diagnosis not present

## 2014-10-07 DIAGNOSIS — Z859 Personal history of malignant neoplasm, unspecified: Secondary | ICD-10-CM | POA: Diagnosis not present

## 2014-10-07 DIAGNOSIS — I2510 Atherosclerotic heart disease of native coronary artery without angina pectoris: Secondary | ICD-10-CM | POA: Diagnosis not present

## 2014-10-07 DIAGNOSIS — R231 Pallor: Secondary | ICD-10-CM | POA: Diagnosis not present

## 2014-10-07 DIAGNOSIS — M109 Gout, unspecified: Secondary | ICD-10-CM | POA: Diagnosis not present

## 2014-10-21 DIAGNOSIS — R634 Abnormal weight loss: Secondary | ICD-10-CM | POA: Diagnosis not present

## 2014-10-21 DIAGNOSIS — E780 Pure hypercholesterolemia: Secondary | ICD-10-CM | POA: Diagnosis not present

## 2014-11-01 ENCOUNTER — Ambulatory Visit: Payer: Self-pay | Admitting: Family Medicine

## 2014-11-01 DIAGNOSIS — J432 Centrilobular emphysema: Secondary | ICD-10-CM | POA: Diagnosis not present

## 2014-11-01 DIAGNOSIS — Z9079 Acquired absence of other genital organ(s): Secondary | ICD-10-CM | POA: Diagnosis not present

## 2014-11-01 DIAGNOSIS — R231 Pallor: Secondary | ICD-10-CM | POA: Diagnosis not present

## 2014-11-01 DIAGNOSIS — I7090 Unspecified atherosclerosis: Secondary | ICD-10-CM | POA: Diagnosis not present

## 2014-11-01 DIAGNOSIS — I2510 Atherosclerotic heart disease of native coronary artery without angina pectoris: Secondary | ICD-10-CM | POA: Diagnosis not present

## 2014-11-01 DIAGNOSIS — R1032 Left lower quadrant pain: Secondary | ICD-10-CM | POA: Diagnosis not present

## 2014-11-01 DIAGNOSIS — R634 Abnormal weight loss: Secondary | ICD-10-CM | POA: Diagnosis not present

## 2014-12-23 NOTE — Op Note (Signed)
PATIENT NAME:  Jeff Rhodes, Jeff Rhodes MR#:  [id number] DATE OF BIRTH:  27-Oct-1950  DATE OF PROCEDURE:  11/30/2012  PREOPERATIVE DIAGNOSIS: Right inguinal hernia.   POSTOPERATIVE DIAGNOSIS: Right inguinal hernia, indirect.   OPERATIVE PROCEDURE: Right inguinal hernia repair with large Ultrapro mesh.   SURGEON: Hervey Ard, MD   ANESTHESIA: General endotracheal under Dr. Benjamine Mola, Marcaine 0.5% with 1:200,000 units epinephrine: 30 mL local infiltration, Toradol 30 mg local infiltration.   ESTIMATED BLOOD LOSS: Minimal.   CLINICAL NOTE: This 65 year old male has developed a symptomatic right inguinal hernia. He was admitted for elective repair. Hair had been removed from the area with clippers. He received Kefzol intravenously prior to the procedure.   OPERATIVE NOTE: With the patient under adequate general endotracheal anesthesia, the abdomen was prepped with Betadine scrub and solution and draped. A 5 cm skin line incision along the anticipated course of the inguinal canal was carried down through the skin and subcutaneous tissue after achieving a local field block with the above-mentioned anesthetic. Hemostasis was with electrocautery. The external oblique was opened in the direction of its fibers. The cord was mobilized and looped with a Penrose drain. A fairly broad-neck indirect defect was identified. This was dissected free into the preperitoneal space. The medial portion of the canal appeared intact. A large Ultrapro mesh was smoothed into the preperitoneal space and the external component laid along the floor of the inguinal canal. This was anchored to the pubic tubercle along the inguinal ligament with interrupted 0 Surgilon sutures. The medial and superior borders were anchored to the transverse abdominis aponeurosis. The external oblique was closed with running 2-0 Vicryl, Scarpa's fascia was closed with a running 3-0 Vicryl and the skin approximated with running 4-0 Vicryl subcuticular suture.  Benzoin, Steri-Strips, Telfa and Tegaderm dressing was then applied.   The patient tolerated the procedure well and was taken to the recovery room in stable condition.   ____________________________ Robert Bellow, MD jwb:cc D: 11/30/2012 15:43:55 ET T: 11/30/2012 16:15:05 ET JOB#: [id number]  cc: Robert Bellow, MD, <Dictator> Guadalupe Maple, MD Dione Petron Amedeo Kinsman MD ELECTRONICALLY SIGNED 11/30/2012 18:15

## 2014-12-29 DIAGNOSIS — E780 Pure hypercholesterolemia: Secondary | ICD-10-CM | POA: Diagnosis not present

## 2014-12-30 DIAGNOSIS — I2510 Atherosclerotic heart disease of native coronary artery without angina pectoris: Secondary | ICD-10-CM | POA: Diagnosis not present

## 2014-12-30 DIAGNOSIS — I10 Essential (primary) hypertension: Secondary | ICD-10-CM | POA: Diagnosis not present

## 2014-12-30 DIAGNOSIS — E782 Mixed hyperlipidemia: Secondary | ICD-10-CM | POA: Insufficient documentation

## 2014-12-30 DIAGNOSIS — I519 Heart disease, unspecified: Secondary | ICD-10-CM | POA: Diagnosis not present

## 2015-04-05 ENCOUNTER — Telehealth: Payer: Self-pay

## 2015-04-05 MED ORDER — SILDENAFIL CITRATE 100 MG PO TABS: 50.0000 mg | ORAL_TABLET | Freq: Every day | ORAL | Status: DC | PRN

## 2015-04-05 NOTE — Telephone Encounter (Signed)
Rx sent to his pharmacy

## 2015-04-05 NOTE — Telephone Encounter (Signed)
His new insurance is no longer covering Cialis. Can you switch him to Viagra?

## 2015-07-14 DIAGNOSIS — Z23 Encounter for immunization: Secondary | ICD-10-CM | POA: Diagnosis not present

## 2015-09-22 DIAGNOSIS — E782 Mixed hyperlipidemia: Secondary | ICD-10-CM | POA: Diagnosis not present

## 2015-09-22 DIAGNOSIS — I2510 Atherosclerotic heart disease of native coronary artery without angina pectoris: Secondary | ICD-10-CM | POA: Diagnosis not present

## 2015-09-22 DIAGNOSIS — I447 Left bundle-branch block, unspecified: Secondary | ICD-10-CM | POA: Diagnosis not present

## 2016-05-07 ENCOUNTER — Other Ambulatory Visit: Payer: Self-pay

## 2016-05-07 NOTE — Telephone Encounter (Signed)
Pt needs 90 day supply pharmacy also requesting alendronate 70mg , cialis 20mg  and atorvastatin,20mg which are not on current med list?

## 2016-05-07 NOTE — Telephone Encounter (Signed)
Patient has not been seen in over 15 months Please contact him; if he is staying at [facility], forward request for medicines to that practice If he is going to be seen at [facility], he'll need an appt; thank you

## 2016-05-07 NOTE — Telephone Encounter (Signed)
Patient has his annual physical scheduled for 05-15-16 with you

## 2016-05-15 ENCOUNTER — Ambulatory Visit (INDEPENDENT_AMBULATORY_CARE_PROVIDER_SITE_OTHER): Payer: Medicare Other | Admitting: Family Medicine

## 2016-05-15 ENCOUNTER — Encounter: Payer: Self-pay | Admitting: Family Medicine

## 2016-05-15 VITALS — BP 102/64 | HR 70 | Temp 98.0°F | Resp 14 | Wt 171.0 lb

## 2016-05-15 DIAGNOSIS — I2510 Atherosclerotic heart disease of native coronary artery without angina pectoris: Secondary | ICD-10-CM | POA: Diagnosis not present

## 2016-05-15 DIAGNOSIS — M1009 Idiopathic gout, multiple sites: Secondary | ICD-10-CM

## 2016-05-15 DIAGNOSIS — M109 Gout, unspecified: Secondary | ICD-10-CM | POA: Insufficient documentation

## 2016-05-15 DIAGNOSIS — Z23 Encounter for immunization: Secondary | ICD-10-CM | POA: Diagnosis not present

## 2016-05-15 DIAGNOSIS — Z720 Tobacco use: Secondary | ICD-10-CM | POA: Diagnosis not present

## 2016-05-15 DIAGNOSIS — M810 Age-related osteoporosis without current pathological fracture: Secondary | ICD-10-CM | POA: Diagnosis not present

## 2016-05-15 DIAGNOSIS — Z5181 Encounter for therapeutic drug level monitoring: Secondary | ICD-10-CM | POA: Diagnosis not present

## 2016-05-15 MED ORDER — ALENDRONATE SODIUM 70 MG PO TABS: 70.0000 mg | ORAL_TABLET | ORAL | 11 refills | Status: DC

## 2016-05-15 MED ORDER — TADALAFIL 20 MG PO TABS: 10.0000 mg | ORAL_TABLET | ORAL | 0 refills | Status: DC

## 2016-05-15 MED ORDER — CYCLOBENZAPRINE HCL 10 MG PO TABS: 10.0000 mg | ORAL_TABLET | Freq: Three times a day (TID) | ORAL | 0 refills | Status: DC | PRN

## 2016-05-15 NOTE — Assessment & Plan Note (Signed)
Check labs 

## 2016-05-15 NOTE — Assessment & Plan Note (Signed)
Check DEXA; vit D, calcium discussed

## 2016-05-15 NOTE — Progress Notes (Signed)
Patient ID: Jeff Millard., male   DOB: [birth year], 65 y.o.   MRN: [id number]   Subjective:   Jeff Suhre. is a 65 y.o. male here for a complete physical exam  Interim issues since last visit:  He has been off of his medicine for months GOUT - he used to take allopurinol for years; Dr. Jeananne Rama started that; he has not had any gout flares in quite some time; when they would come up, they would be in the knee and right foot, big toe joint; gout does not run in the family; he does eat gravy some; [place] rarely; his wife uses chicken stock in a number of her recipe He has modified his drinking habits, drinking less; less than 14 drinks a week Drinking fair amount of water  Osteoporosis; mother and MGM and brother has had some issue with it; he has compression fractures on CT scan He does not get enough calcium in his diet; drinks some milk; some salads; no salmon w/bones; occasional almonds, other nuts; discussed Ca2+ issue  Go through [employer] naggy back pain if he lifts something to heavy; uses flexeril for the occasional back pain  Seeing Dr. Nehemiah Massed for CAD; coronary artery athero found on CT scan; they did stress test and echo; he started atorvastatin, but he quit taking it and had wrist pain  Past Medical History:  Diagnosis Date  . Cellulitis and abscess of trunk   . Gout 2011  . Hernia   . Malignant neoplasm of connective and other soft tissue of thorax   . Neoplasm of uncertain behavior of skin   . Other benign neoplasm of connective and other soft tissue of thorax   . Personal history of tobacco use, presenting hazards to health   . Prostate cancer ([place]) 2011  . Special screening for malignant neoplasms, colon   . Unspecified essential hypertension    Past Surgical History:  Procedure Laterality Date  . 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 The patient suffered divison of the long thoracic nreve  during resection, resulting in a winged scapula.  . COLONOSCOPY  2007 and 2012   2007 done in [place]; 2012 done by Dr. Vira Agar  . INGUINAL HERNIA REPAIR Left 2010  . INGUINAL HERNIA REPAIR Right 2014  . LIPOMA EXCISION  2007 and 2012   right axilla/chest  . POLYPECTOMY  2012  . PROSTATECTOMY  April 2011  . THORACOTOMY  2012   No family history on file. Social History  Substance Use Topics  . Smoking status: Former Smoker    Packs/day: 1.00    Years: 40.00    Quit date: 09/02/2005  . Smokeless tobacco: Never Used  . Alcohol use 0.5 - 1.0 oz/week    1 - 2 drink(s) per week  MD note: middle of relapse; current smoker; smoking 12/ to 3/4 ppd  Review of Systems  Objective:   Vitals:   05/15/16 1143  BP: 102/64  Pulse: 70  Resp: 14  Temp: 98 F (36.7 C)  TempSrc: Oral  SpO2: 97%  Weight: 171 lb (77.6 kg)   Body mass index is 24.54 kg/m. Wt Readings from Last 3 Encounters:  05/15/16 171 lb (77.6 kg)  02/07/14 159 lb (72.1 kg)  03/01/13 160 lb (72.6 kg)   Physical Exam  Constitutional: He appears well-developed and well-nourished. No distress.  HENT:  Head: Normocephalic and atraumatic.  Eyes: EOM are normal. No scleral icterus.  Neck:  No thyromegaly present.  Cardiovascular: Normal rate and regular rhythm.   Pulmonary/Chest: Effort normal and breath sounds normal.  Abdominal: Soft. Bowel sounds are normal. He exhibits no distension.  Musculoskeletal: He exhibits no edema.  Neurological: He is alert.  Skin: Skin is warm and dry. No pallor.  Psychiatric: He has a normal mood and affect. His behavior is normal. Judgment and thought content normal.    Assessment/Plan:   Problem List Items Addressed This Visit      Cardiovascular and Mediastinum   Coronary artery disease    Avoid saturated fats; had aches with atoravatatin; check lipids, goal LDL less than 70; smoking cessation urged      Relevant Medications   tadalafil (CIALIS) 20 MG tablet   Other  Relevant Orders   Lipid panel (Completed)     Musculoskeletal and Integument   Osteoporosis    Check DEXA; vit D, calcium discussed      Relevant Medications   alendronate (FOSAMAX) 70 MG tablet   Other Relevant Orders   DG Bone Density   VITAMIN D 25 Hydroxy (Vit-D Deficiency, Fractures) (Completed)     Other   Tobacco abuse    Encouraged smoking cessation      Gout - Primary    Check uric acid today; avoid purine-rich foods, e\specially gravies and [place] and chicken soup; try tart cherry; consider allopurinol      Relevant Orders   Uric acid (Completed)   Encounter for medication monitoring    Check labs      Relevant Orders   COMPLETE METABOLIC PANEL WITH GFR (Completed)   CBC with Differential/Platelet (Completed)    Other Visit Diagnoses    Needs flu shot       Relevant Orders   Flu vaccine HIGH DOSE PF (Fluzone High dose) (Completed)      Meds ordered this encounter  Medications  . alendronate (FOSAMAX) 70 MG tablet    Sig: Take 1 tablet (70 mg total) by mouth every 7 (seven) days. Take with a full glass of water on an empty stomach.    Dispense:  4 tablet    Refill:  11  . tadalafil (CIALIS) 20 MG tablet    Sig: Take 0.5-1 tablets (10-20 mg total) by mouth every other day. If needed for intimacy    Dispense:  30 tablet    Refill:  0  . cyclobenzaprine (FLEXERIL) 10 MG tablet    Sig: Take 1 tablet (10 mg total) by mouth 3 (three) times daily as needed for muscle spasms.    Dispense:  30 tablet    Refill:  0   Orders Placed This Encounter  Procedures  . DG Bone Density    Scheduling Instructions:     Please check on date of last DEXA, thank you, send copy please    Order Specific Question:   Reason for Exam (SYMPTOM  OR DIAGNOSIS REQUIRED)    Answer:   osteoporosis    Order Specific Question:   Preferred imaging location?    Answer:    [facility]  . Flu vaccine HIGH DOSE PF (Fluzone High dose)  . VITAMIN D 25 Hydroxy (Vit-D Deficiency,  Fractures)  . COMPLETE METABOLIC PANEL WITH GFR  . CBC with Differential/Platelet  . Lipid panel  . Uric acid    Follow up plan: Return in about 1 month (around 06/14/2016) for complete physical or medicare wellness, check with your insurance.  An After Visit Summary was printed and given to the patient.

## 2016-05-15 NOTE — Patient Instructions (Addendum)
I'll recommend that you don't use chicken stock or beef stock and that you limit gravies Consider tart cherry to help prevent gout I do encourage you to quit smoking Call [phone] to sign up for smoking cessation classes You can call 1-800-QUIT-NOW to talk with a smoking cessation coach    Gout Gout is an inflammatory arthritis caused by a buildup of uric acid crystals in the joints. Uric acid is a chemical that is normally present in the blood. When the level of uric acid in the blood is too high it can form crystals that deposit in your joints and tissues. This causes joint redness, soreness, and swelling (inflammation). Repeat attacks are common. Over time, uric acid crystals can form into masses (tophi) near a joint, destroying bone and causing disfigurement. Gout is treatable and often preventable. CAUSES  The disease begins with elevated levels of uric acid in the blood. Uric acid is produced by your body when it breaks down a naturally found substance called purines. Certain foods you eat, such as meats and fish, contain high amounts of purines. Causes of an elevated uric acid level include:  Being passed down from parent to child (heredity).  Diseases that cause increased uric acid production (such as obesity, psoriasis, and certain cancers).  Excessive alcohol use.  Diet, especially diets rich in meat and seafood.  Medicines, including certain cancer-fighting medicines (chemotherapy), water pills (diuretics), and aspirin.  Chronic kidney disease. The kidneys are no longer able to remove uric acid well.  Problems with metabolism. Conditions strongly associated with gout include:  Obesity.  High blood pressure.  High cholesterol.  Diabetes. Not everyone with elevated uric acid levels gets gout. It is not understood why some people get gout and others do not. Surgery, joint injury, and eating too much of certain foods are some of the factors that can lead to gout  attacks. SYMPTOMS   An attack of gout comes on quickly. It causes intense pain with redness, swelling, and warmth in a joint.  Fever can occur.  Often, only one joint is involved. Certain joints are more commonly involved:  Base of the big toe.  Knee.  Ankle.  Wrist.  Finger. Without treatment, an attack usually goes away in a few days to weeks. Between attacks, you usually will not have symptoms, which is different from many other forms of arthritis. DIAGNOSIS  Your caregiver will suspect gout based on your symptoms and exam. In some cases, tests may be recommended. The tests may include:  Blood tests.  Urine tests.  X-rays.  Joint fluid exam. This exam requires a needle to remove fluid from the joint (arthrocentesis). Using a microscope, gout is confirmed when uric acid crystals are seen in the joint fluid. TREATMENT  There are two phases to gout treatment: treating the sudden onset (acute) attack and preventing attacks (prophylaxis).  Treatment of an Acute Attack.  Medicines are used. These include anti-inflammatory medicines or steroid medicines.  An injection of steroid medicine into the affected joint is sometimes necessary.  The painful joint is rested. Movement can worsen the arthritis.  You may use warm or cold treatments on painful joints, depending which works best for you.  Treatment to Prevent Attacks.  If you suffer from frequent gout attacks, your caregiver may advise preventive medicine. These medicines are started after the acute attack subsides. These medicines either help your kidneys eliminate uric acid from your body or decrease your uric acid production. You may need to stay on these  medicines for a very long time.  The early phase of treatment with preventive medicine can be associated with an increase in acute gout attacks. For this reason, during the first few months of treatment, your caregiver may also advise you to take medicines usually used  for acute gout treatment. Be sure you understand your caregiver's directions. Your caregiver may make several adjustments to your medicine dose before these medicines are effective.  Discuss dietary treatment with your caregiver or dietitian. Alcohol and drinks high in sugar and fructose and foods such as meat, poultry, and seafood can increase uric acid levels. Your caregiver or dietitian can advise you on drinks and foods that should be limited. HOME CARE INSTRUCTIONS   Do not take aspirin to relieve pain. This raises uric acid levels.  Only take over-the-counter or prescription medicines for pain, discomfort, or fever as directed by your caregiver.  Rest the joint as much as possible. When in bed, keep sheets and blankets off painful areas.  Keep the affected joint raised (elevated).  Apply warm or cold treatments to painful joints. Use of warm or cold treatments depends on which works best for you.  Use crutches if the painful joint is in your leg.  Drink enough fluids to keep your urine clear or pale yellow. This helps your body get rid of uric acid. Limit alcohol, sugary drinks, and fructose drinks.  Follow your dietary instructions. Pay careful attention to the amount of protein you eat. Your daily diet should emphasize fruits, vegetables, whole grains, and fat-free or low-fat milk products. Discuss the use of coffee, vitamin C, and cherries with your caregiver or dietitian. These may be helpful in lowering uric acid levels.  Maintain a healthy body weight. SEEK MEDICAL CARE IF:   You develop diarrhea, vomiting, or any side effects from medicines.  You do not feel better in 24 hours, or you are getting worse. SEEK IMMEDIATE MEDICAL CARE IF:   Your joint becomes suddenly more tender, and you have chills or a fever. MAKE SURE YOU:   Understand these instructions.  Will watch your condition.  Will get help right away if you are not doing well or get worse.   This information  is not intended to replace advice given to you by your health care provider. Make sure you discuss any questions you have with your health care provider.   Document Released: 08/16/2000 Document Revised: 09/09/2014 Document Reviewed: 04/01/2012 Elsevier Interactive Patient Education 2016 [employer] Coronary Artery Disease, Male Coronary artery disease (CAD) is a process in which the blood vessels of the heart (coronary arteries) become narrow or blocked. The narrowing or blockage can lead to decreased blood flow to the heart muscle (angina). Prolonged reduced blood flow can cause a heart attack (myocardial infarction, MI). Because CAD is the leading cause of death in men, it is important to understand what causes this condition and how it is treated. CAUSES Atherosclerosis is the cause of CAD. This is the buildup of fat and cholesterol (plaque) on the inside of the arteries. Over time, the plaque may narrow or block the artery, and this will lessen blood flow to the heart. Plaque can also become weak and break off within a coronary artery to form a clot and cause a sudden blockage. RISK FACTORS Many risk factors increase your chances of getting CAD, including:  High cholesterol levels.  High blood pressure (hypertension).  Tobacco use.  Diabetes.  Age. Men over age 54 are at a greater risk  of CAD.  Gender. Men often develop CAD earlier in life than women.  Family history of CAD.  Obesity.  Lack of exercise.  A diet high in saturated fats. SYMPTOMS  Many people do not experience any symptoms during the early stages of CAD. As the condition progresses, symptoms may include:   Chest pain.  The pain can be described as a crushing or squeezing in the chest, or a tightness, pressure, fullness, or heaviness in the chest.  The pain can last more than a few minutes or can stop and recur.  Pain in the arms, neck, jaw, or back.  Unexplained heartburn or indigestion.  Shortness of  breath.  Nausea.  Sudden cold sweats. Less common symptoms of CAD in men can include:  Fatigue.  Unexplained feelings of nervousness or anxiety.  Weakness.  Diarrhea.  Sudden light-headedness. DIAGNOSIS  Tests to diagnose CAD may include:  ECG (electrocardiogram).  Exercise stress test. This looks for signs of blockage when the heart is being exercised.  Pharmacologic stress test. This test looks for signs of blockage when the heart is being stressed with a medicine.  Blood tests.  Coronary angiogram. This is a procedure to look at the coronary arteries to see if there is any blockage. TREATMENT The treatment of CAD may include the following:  Healthy behavioral changes to reduce or control risk factors.  Medicine.  Coronary stenting.A stent helps to keep an artery open.  Coronary angioplasty. This procedure widens a narrowed or blocked artery.  Coronary arterybypass surgery. This will allow your blood to pass the blockage (bypass) to reach your heart. HOME CARE INSTRUCTIONS  Take medicines only as directed by your health care provider.  Do not take the following medicines unless your health care provider approves:  Nonsteroidal anti-inflammatory drugs (NSAIDs), such as ibuprofen, naproxen, or celecoxib.  Vitamin supplements that contain vitamin A, vitamin E, or both.  Manage other health conditions such as hypertension and diabetes as directed by your health care provider.  Follow a heart-healthy diet. A dietitian can help to educate you about healthy food options and changes.  Use healthy cooking methods such as roasting, grilling, broiling, baking, poaching, steaming, or stir-frying. Talk to a dietitian to learn more about healthy cooking methods.  Follow an exercise program approved by your health care provider.  Maintain a healthy weight. Lose weight as approved by your health care provider.  Plan rest periods when fatigued.  Learn to manage  stress.  Do not use any tobacco products, including cigarettes, chewing tobacco, or electronic cigarettes. If you need help quitting, ask your health care provider.  If you drink alcohol, and your health care provider approves, limit your alcohol intake to no more than 1 drink per day. One drink equals 12 ounces of beer, 5 ounces of wine, or 1 ounces of hard liquor.  Stop illegal drug use.  Your health care provider may ask you to monitor your blood pressure. A blood pressure reading consists of a higher number over a lower number, such as 110 over 72, which is written as 110/72. Ideally, your blood pressure should be:  Below 140/90 if you have no other medical conditions.  Below 130/80 if you have diabetes or kidney disease.  Keep all follow-up visits as directed by your health care provider. This is important. SEEK IMMEDIATE MEDICAL CARE IF:  You have pain in your chest, neck, arm, jaw, stomach, or back that lasts more than a few minutes, is recurring, or is unrelieved by  taking medicine under your tongue (sublingualnitroglycerin).  You have profuse sweating without cause.  You have unexplained:  Heartburn or indigestion.  Shortness of breath or difficulty breathing.  Nausea or vomiting.  Fatigue.  Feelings of nervousness or anxiety.  Weakness.  Diarrhea.  You have sudden light-headedness or dizziness.  You faint. These symptoms may represent a serious problem that is an emergency. Do not wait to see if the symptoms will go away. Get medical help right away. Call your local emergency services (911 in the [place] Do not drive yourself to the hospital.   This information is not intended to replace advice given to you by your health care provider. Make sure you discuss any questions you have with your health care provider.   Document Released: 03/16/2014 Document Reviewed: 03/16/2014 Elsevier Interactive Patient Education 2016 [employer]. Smoking Cessation, Tips for  Success If you are ready to quit smoking, congratulations! You have chosen to help yourself be healthier. Cigarettes bring nicotine, tar, carbon monoxide, and other irritants into your body. Your lungs, heart, and blood vessels will be able to work better without these poisons. There are many different ways to quit smoking. Nicotine gum, nicotine patches, a nicotine inhaler, or nicotine nasal spray can help with physical craving. Hypnosis, support groups, and medicines help break the habit of smoking. WHAT THINGS CAN I DO TO MAKE QUITTING EASIER?  Here are some tips to help you quit for good:  Pick a date when you will quit smoking completely. Tell all of your friends and family about your plan to quit on that date.  Do not try to slowly cut down on the number of cigarettes you are smoking. Pick a quit date and quit smoking completely starting on that day.  Throw away all cigarettes.   Clean and remove all ashtrays from your home, work, and car.  On a card, write down your reasons for quitting. Carry the card with you and read it when you get the urge to smoke.  Cleanse your body of nicotine. Drink enough water and fluids to keep your urine clear or pale yellow. Do this after quitting to flush the nicotine from your body.  Learn to predict your moods. Do not let a bad situation be your excuse to have a cigarette. Some situations in your life might tempt you into wanting a cigarette.  Never have "just one" cigarette. It leads to wanting another and another. Remind yourself of your decision to quit.  Change habits associated with smoking. If you smoked while driving or when feeling stressed, try other activities to replace smoking. Stand up when drinking your coffee. Brush your teeth after eating. Sit in a different chair when you read the paper. Avoid alcohol while trying to quit, and try to drink fewer caffeinated beverages. Alcohol and caffeine may urge you to smoke.  Avoid foods and drinks  that can trigger a desire to smoke, such as sugary or spicy foods and alcohol.  Ask people who smoke not to smoke around you.  Have something planned to do right after eating or having a cup of coffee. For example, plan to take a walk or exercise.  Try a relaxation exercise to calm you down and decrease your stress. Remember, you may be tense and nervous for the first 2 weeks after you quit, but this will pass.  Find new activities to keep your hands busy. Play with a pen, coin, or rubber band. Doodle or draw things on paper.  Brush your  teeth right after eating. This will help cut down on the craving for the taste of tobacco after meals. You can also try mouthwash.   Use oral substitutes in place of cigarettes. Try using lemon drops, carrots, cinnamon sticks, or chewing gum. Keep them handy so they are available when you have the urge to smoke.  When you have the urge to smoke, try deep breathing.  Designate your home as a nonsmoking area.  If you are a heavy smoker, ask your health care provider about a prescription for nicotine chewing gum. It can ease your withdrawal from nicotine.  Reward yourself. Set aside the cigarette money you save and buy yourself something nice.  Look for support from others. Join a support group or smoking cessation program. Ask someone at home or at work to help you with your plan to quit smoking.  Always ask yourself, "Do I need this cigarette or is this just a reflex?" Tell yourself, "Today, I choose not to smoke," or "I do not want to smoke." You are reminding yourself of your decision to quit.  Do not replace cigarette smoking with electronic cigarettes (commonly called e-cigarettes). The safety of e-cigarettes is unknown, and some may contain harmful chemicals.  If you relapse, do not give up! Plan ahead and think about what you will do the next time you get the urge to smoke. HOW WILL I FEEL WHEN I QUIT SMOKING? You may have symptoms of withdrawal  because your body is used to nicotine (the addictive substance in cigarettes). You may crave cigarettes, be irritable, feel very hungry, cough often, get headaches, or have difficulty concentrating. The withdrawal symptoms are only temporary. They are strongest when you first quit but will go away within 10-14 days. When withdrawal symptoms occur, stay in control. Think about your reasons for quitting. Remind yourself that these are signs that your body is healing and getting used to being without cigarettes. Remember that withdrawal symptoms are easier to treat than the major diseases that smoking can cause.  Even after the withdrawal is over, expect periodic urges to smoke. However, these cravings are generally short lived and will go away whether you smoke or not. Do not smoke! WHAT RESOURCES ARE AVAILABLE TO HELP ME QUIT SMOKING? Your health care provider can direct you to community resources or hospitals for support, which may include:  Group support.  Education.  Hypnosis.  Therapy.   This information is not intended to replace advice given to you by your health care provider. Make sure you discuss any questions you have with your health care provider.   Document Released: 05/17/2004 Document Revised: 09/09/2014 Document Reviewed: 02/04/2013 Elsevier Interactive Patient Education [employer].

## 2016-05-15 NOTE — Assessment & Plan Note (Signed)
Encouraged smoking cessation 

## 2016-05-15 NOTE — Assessment & Plan Note (Signed)
Check uric acid today; avoid purine-rich foods, e\specially gravies and [place] and chicken soup; try tart cherry; consider allopurinol

## 2016-05-15 NOTE — Assessment & Plan Note (Signed)
Avoid saturated fats; had aches with atoravatatin; check lipids, goal LDL less than 70; smoking cessation urged

## 2016-05-16 ENCOUNTER — Encounter: Payer: Self-pay | Admitting: Family Medicine

## 2016-05-16 LAB — COMPLETE METABOLIC PANEL WITH GFR
ALT: 14 U/L (ref 9–46)
AST: 17 U/L (ref 10–35)
Albumin: 3.9 g/dL (ref 3.6–5.1)
Alkaline Phosphatase: 66 U/L (ref 40–115)
BILIRUBIN TOTAL: 0.5 mg/dL (ref 0.2–1.2)
BUN: 8 mg/dL (ref 7–25)
CO2: 26 mmol/L (ref 20–31)
Calcium: 9 mg/dL (ref 8.6–10.3)
Chloride: 105 mmol/L (ref 98–110)
Creat: 0.95 mg/dL (ref 0.70–1.25)
GFR, EST NON AFRICAN AMERICAN: 82 mL/min (ref >=60)
GLUCOSE: 82 mg/dL (ref 65–99)
POTASSIUM: 4.4 mmol/L (ref 3.5–5.3)
SODIUM: 141 mmol/L (ref 135–146)
TOTAL PROTEIN: 6.5 g/dL (ref 6.1–8.1)

## 2016-05-16 LAB — CBC WITH DIFFERENTIAL/PLATELET
BASOS ABS: 68 {cells}/uL (ref 0–200)
BASOS PCT: 1 %
EOS ABS: 136 {cells}/uL (ref 15–500)
EOS PCT: 2 %
HCT: 43.5 % (ref 38.5–50.0)
HEMOGLOBIN: 14.4 g/dL (ref 13.2–17.1)
LYMPHS ABS: 1224 {cells}/uL (ref 850–3900)
Lymphocytes Relative: 18 %
MCH: 31.7 pg (ref 27.0–33.0)
MCHC: 33.1 g/dL (ref 32.0–36.0)
MCV: 95.8 fL (ref 80.0–100.0)
MONOS PCT: 10 %
MPV: 10.7 fL (ref 7.5–12.5)
Monocytes Absolute: 680 cells/uL (ref 200–950)
NEUTROS ABS: 4692 {cells}/uL (ref 1500–7800)
Neutrophils Relative %: 69 %
PLATELETS: 226 10*3/uL (ref 140–400)
RBC: 4.54 MIL/uL (ref 4.20–5.80)
RDW: 13.6 % (ref 11.0–15.0)
WBC: 6.8 10*3/uL (ref 3.8–10.8)

## 2016-05-16 LAB — LIPID PANEL
Cholesterol: 211 mg/dL — ABNORMAL HIGH (ref 125–200)
HDL: 61 mg/dL (ref >=40)
LDL Cholesterol: 118 mg/dL (ref <130)
Total CHOL/HDL Ratio: 3.5 Ratio (ref <=5.0)
Triglycerides: 158 mg/dL — ABNORMAL HIGH (ref <150)
VLDL: 32 mg/dL — ABNORMAL HIGH (ref <30)

## 2016-05-16 LAB — URIC ACID: URIC ACID, SERUM: 6.8 mg/dL (ref 4.0–8.0)

## 2016-05-16 LAB — VITAMIN D 25 HYDROXY (VIT D DEFICIENCY, FRACTURES): Vit D, 25-Hydroxy: 35 ng/mL (ref 30–100)

## 2016-05-16 NOTE — Progress Notes (Signed)
Letter to pt re: labs

## 2016-06-17 ENCOUNTER — Other Ambulatory Visit: Payer: Self-pay | Admitting: Family Medicine

## 2016-06-17 ENCOUNTER — Ambulatory Visit (INDEPENDENT_AMBULATORY_CARE_PROVIDER_SITE_OTHER): Payer: Medicare Other | Admitting: Family Medicine

## 2016-06-17 ENCOUNTER — Encounter: Payer: Self-pay | Admitting: Family Medicine

## 2016-06-17 VITALS — BP 110/62 | HR 73 | Temp 98.0°F | Resp 16 | Ht 69.0 in | Wt 170.2 lb

## 2016-06-17 DIAGNOSIS — Z08 Encounter for follow-up examination after completed treatment for malignant neoplasm: Secondary | ICD-10-CM | POA: Insufficient documentation

## 2016-06-17 DIAGNOSIS — D485 Neoplasm of uncertain behavior of skin: Secondary | ICD-10-CM | POA: Insufficient documentation

## 2016-06-17 DIAGNOSIS — M810 Age-related osteoporosis without current pathological fracture: Secondary | ICD-10-CM

## 2016-06-17 DIAGNOSIS — Z720 Tobacco use: Secondary | ICD-10-CM

## 2016-06-17 DIAGNOSIS — Z0000 Encounter for general adult medical examination without abnormal findings: Secondary | ICD-10-CM

## 2016-06-17 DIAGNOSIS — I2510 Atherosclerotic heart disease of native coronary artery without angina pectoris: Secondary | ICD-10-CM

## 2016-06-17 DIAGNOSIS — M1009 Idiopathic gout, multiple sites: Secondary | ICD-10-CM

## 2016-06-17 DIAGNOSIS — Z8546 Personal history of malignant neoplasm of prostate: Secondary | ICD-10-CM

## 2016-06-17 DIAGNOSIS — Z0001 Encounter for general adult medical examination with abnormal findings: Secondary | ICD-10-CM

## 2016-06-17 DIAGNOSIS — I77811 Abdominal aortic ectasia: Secondary | ICD-10-CM | POA: Insufficient documentation

## 2016-06-17 NOTE — Assessment & Plan Note (Signed)
Order PSA.

## 2016-06-17 NOTE — Patient Instructions (Addendum)
Try to get 38 or more grams of fiber daily Try to limit saturated fats in your diet (bologna, hot dogs, barbeque, cheeseburgers, hamburgers, steak, bacon, sausage, cheese, etc.) and get more fresh fruits, vegetables, and whole grains  I do encourage you to quit smoking Call [phone] to sign up for smoking cessation classes You can call 1-800-QUIT-NOW to talk with a smoking cessation coach  Please do call to schedule your bone density study; the number to schedule one at either [facility] or [facility] is [phone]  Your next abdominal scan will be due around June 2020  Do start taking 81 mg coated aspirin daily  Please do see Dr. Nehemiah Massed (dermatologist)  Health Maintenance, Male A healthy lifestyle and preventative care can promote health and wellness.  Maintain regular health, dental, and eye exams.  Eat a healthy diet. Foods like vegetables, fruits, whole grains, low-fat dairy products, and lean protein foods contain the nutrients you need and are low in calories. Decrease your intake of foods high in solid fats, added sugars, and salt. Get information about a proper diet from your health care provider, if necessary.  Regular physical exercise is one of the most important things you can do for your health. Most adults should get at least 150 minutes of moderate-intensity exercise (any activity that increases your heart rate and causes you to sweat) each week. In addition, most adults need muscle-strengthening exercises on 2 or more days a week.   Maintain a healthy weight. The body mass index (BMI) is a screening tool to identify possible weight problems. It provides an estimate of body fat based on height and weight. Your health care provider can find your BMI and can help you achieve or maintain a healthy weight. For males 20 years and older:  A BMI below 18.5 is considered underweight.  A BMI of 18.5 to 24.9 is normal.  A BMI of 25 to 29.9 is  considered overweight.  A BMI of 30 and above is considered obese.  Maintain normal blood lipids and cholesterol by exercising and minimizing your intake of saturated fat. Eat a balanced diet with plenty of fruits and vegetables. Blood tests for lipids and cholesterol should begin at age 17 and be repeated every 5 years. If your lipid or cholesterol levels are high, you are over age 1, or you are at high risk for heart disease, you may need your cholesterol levels checked more frequently.Ongoing high lipid and cholesterol levels should be treated with medicines if diet and exercise are not working.  If you smoke, find out from your health care provider how to quit. If you do not use tobacco, do not start.  Lung cancer screening is recommended for adults aged 33-80 years who are at high risk for developing lung cancer because of a history of smoking. A yearly low-dose CT scan of the lungs is recommended for people who have at least a 30-pack-year history of smoking and are current smokers or have quit within the past 15 years. A pack year of smoking is smoking an average of 1 pack of cigarettes a day for 1 year (for example, a 30-pack-year history of smoking could mean smoking 1 pack a day for 30 years or 2 packs a day for 15 years). Yearly screening should continue until the smoker has stopped smoking for at least 15 years. Yearly screening should be stopped for people who develop a health problem that would prevent them from having lung cancer treatment.  If you choose to drink alcohol, do not have more than 2 drinks per day. One drink is considered to be 12 oz (360 mL) of beer, 5 oz (150 mL) of wine, or 1.5 oz (45 mL) of liquor.  Avoid the use of street drugs. Do not share needles with anyone. Ask for help if you need support or instructions about stopping the use of drugs.  High blood pressure causes heart disease and increases the risk of stroke. High blood pressure is more likely to develop  in:  People who have blood pressure in the end of the normal range (100-139/85-89 mm Hg).  People who are overweight or obese.  People who are African American.  If you are 29-24 years of age, have your blood pressure checked every 3-5 years. If you are 47 years of age or older, have your blood pressure checked every year. You should have your blood pressure measured twice--once when you are at a hospital or clinic, and once when you are not at a hospital or clinic. Record the average of the two measurements. To check your blood pressure when you are not at a hospital or clinic, you can use:  An automated blood pressure machine at a pharmacy.  A home blood pressure monitor.  If you are 25-17 years old, ask your health care provider if you should take aspirin to prevent heart disease.  Diabetes screening involves taking a blood sample to check your fasting blood sugar level. This should be done once every 3 years after age 20 if you are at a normal weight and without risk factors for diabetes. Testing should be considered at a younger age or be carried out more frequently if you are overweight and have at least 1 risk factor for diabetes.  Colorectal cancer can be detected and often prevented. Most routine colorectal cancer screening begins at the age of 29 and continues through age 3. However, your health care provider may recommend screening at an earlier age if you have risk factors for colon cancer. On a yearly basis, your health care provider may provide home test kits to check for hidden blood in the stool. A small camera at the end of a tube may be used to directly examine the colon (sigmoidoscopy or colonoscopy) to detect the earliest forms of colorectal cancer. Talk to your health care provider about this at age 74 when routine screening begins. A direct exam of the colon should be repeated every 5-10 years through age 40, unless early forms of precancerous polyps or small growths are  found.  People who are at an increased risk for hepatitis B should be screened for this virus. You are considered at high risk for hepatitis B if:  You were born in a country where hepatitis B occurs often. Talk with your health care provider about which countries are considered high risk.  Your parents were born in a high-risk country and you have not received a shot to protect against hepatitis B (hepatitis B vaccine).  You have HIV or AIDS.  You use needles to inject street drugs.  You live with, or have sex with, someone who has hepatitis B.  You are a man who has sex with other men (MSM).  You get hemodialysis treatment.  You take certain medicines for conditions like cancer, organ transplantation, and autoimmune conditions.  Hepatitis C blood testing is recommended for all people born from [birth year] through 1967 and any individual with known risk factors for hepatitis C.  Healthy men should no longer receive prostate-specific antigen (PSA) blood tests as part of routine cancer screening. Talk to your health care provider about prostate cancer screening.  Testicular cancer screening is not recommended for adolescents or adult males who have no symptoms. Screening includes self-exam, a health care provider exam, and other screening tests. Consult with your health care provider about any symptoms you have or any concerns you have about testicular cancer.  Practice safe sex. Use condoms and avoid high-risk sexual practices to reduce the spread of sexually transmitted infections (STIs).  You should be screened for STIs, including gonorrhea and chlamydia if:  You are sexually active and are younger than 24 years.  You are older than 24 years, and your health care provider tells you that you are at risk for this type of infection.  Your sexual activity has changed since you were last screened, and you are at an increased risk for chlamydia or gonorrhea. Ask your health care provider if  you are at risk.  If you are at risk of being infected with HIV, it is recommended that you take a prescription medicine daily to prevent HIV infection. This is called pre-exposure prophylaxis (PrEP). You are considered at risk if:  You are a man who has sex with other men (MSM).  You are a heterosexual man who is sexually active with multiple partners.  You take drugs by injection.  You are sexually active with a partner who has HIV.  Talk with your health care provider about whether you are at high risk of being infected with HIV. If you choose to begin PrEP, you should first be tested for HIV. You should then be tested every 3 months for as long as you are taking PrEP.  Use sunscreen. Apply sunscreen liberally and repeatedly throughout the day. You should seek shade when your shadow is shorter than you. Protect yourself by wearing long sleeves, pants, a wide-brimmed hat, and sunglasses year round whenever you are outdoors.  Tell your health care provider of new moles or changes in moles, especially if there is a change in shape or color. Also, tell your health care provider if a mole is larger than the size of a pencil eraser.  A one-time screening for abdominal aortic aneurysm (AAA) and surgical repair of large AAAs by ultrasound is recommended for men aged 19-75 years who are current or former smokers.  Stay current with your vaccines (immunizations).   This information is not intended to replace advice given to you by your health care provider. Make sure you discuss any questions you have with your health care provider.   Document Released: 02/15/2008 Document Revised: 09/09/2014 Document Reviewed: 01/14/2011 Elsevier Interactive Patient Education [employer].

## 2016-06-17 NOTE — Assessment & Plan Note (Signed)
Try to limit [place], gravies, organ meats; try tart cherry juice

## 2016-06-17 NOTE — Assessment & Plan Note (Signed)
Encouraged pt to have the DEXA scan done soon; number given so he can schedule at his convenience

## 2016-06-17 NOTE — Assessment & Plan Note (Signed)
encoruaged patient to see his dermatologist

## 2016-06-17 NOTE — Assessment & Plan Note (Signed)
Discussed role of tobacco use in atherosclerosis and AAA; urged him to consider quitting, pick a quit date, great American smoke-out in November, or someone's birthday as a special occasion, or even day after Halloween (smoking is a scary thing); we discussed this but he does not sound extremely motivated to quit; gave him QUIT-NOW number and information on smoking cessation class at the hospital if being with other smokers would be helpful

## 2016-06-17 NOTE — Assessment & Plan Note (Signed)
Showed cross-sectional model of artery with atherosclerosis at different stages; if we can get LDL under 70, we may be able to reverse plaque build-up; also discussed use of daily aspirin to help lessen chance of thrombus formation by platelet aggregation and roughened endothelium; he will start back on the aspirin and f/u with his cardiologist; we discussed statin use; he does not want to try another statin at this time; he wants to try TLC and have cardiologist check lipids in January

## 2016-06-17 NOTE — Assessment & Plan Note (Signed)
USPSTF grade A and B recommendations reviewed with patient; age-appropriate recommendations, preventive care, screening tests, etc discussed and encouraged; healthy living encouraged; see AVS for patient education given to patient  

## 2016-06-17 NOTE — Progress Notes (Signed)
Patient ID: Jeff Rhodes., male   DOB: 1951-01-29, 65 y.o.   MRN: [id number]   Subjective:   Jeff Rhodes. is a 65 y.o. male here for a complete physical exam  Interim issues since last visit: none  USPSTF grade A and B recommendations Alcohol: drinks less, 1-2 drinks per day; less than 14 per weeks Depression:  Depression screen [facility] 2/9 06/17/2016 05/15/2016  Decreased Interest 0 0  Down, Depressed, Hopeless 0 0  PHQ - 2 Score 0 0  Hypertension: controlled Obesity: okay Tobacco use: current smoker; smoking 1/2 to 1 ppd; he had quit for 9 months HIV, hep B, hep C:  STD testing and prevention (chl/gon/syphilis):  Lipids: LDL 118; not much bologna; does eat fatty meats, hot dogs once a week; hamburgers once a week; bacon not as much any more; occasional sausage biscuit; not much cheese; used to take atoravastatin; did have side effects; wrists got achy; he would like to skip another statin Glucose: 82 Colorectal cancer: due for colonoscopy; he has not scheduled it yet, but will Breast cancer: no lumps Lung cancer: smokes Osteoporosis: check dexa AAA: no fam hx Aspirin: intermittently taking this; Dr. Nehemiah Massed (cardiologist) wants him to take aspirin Diet: fair eater Exercise: no regular exercise; works around the house, no chest pain Skin cancer: place on the left side of the nose Prostate: last PSA was a year ago; no bone pain, no trouble urinating, etc; used to see Dr. Rutherford Limerick Dermatology: Dr. Nehemiah Massed Cardiology: Dr. Nehemiah Massed  Past Medical History:  Diagnosis Date  . Cellulitis and abscess of trunk   . Gout 2011  . Hernia   . Malignant neoplasm of connective and other soft tissue of thorax   . Neoplasm of uncertain behavior of skin   . Other benign neoplasm of connective and other soft tissue of thorax   . Personal history of tobacco use, presenting hazards to health   . Prostate cancer ([place]) 2011  . Special screening for malignant neoplasms, colon    . Unspecified essential hypertension    Past Surgical History:  Procedure Laterality Date  . 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 The patient suffered divison of the long thoracic nreve during resection, resulting in a winged scapula.  . COLONOSCOPY  2007 and 2012   2007 done in [place]; 2012 done by Dr. Vira Agar  . INGUINAL HERNIA REPAIR Left 2010  . INGUINAL HERNIA REPAIR Right 2014  . LIPOMA EXCISION  2007 and 2012   right axilla/chest  . POLYPECTOMY  2012  . PROSTATECTOMY  April 2011  . THORACOTOMY  2012   Family History  Problem Relation Age of Onset  . COPD Mother   . Hypertension Mother   . Osteoporosis Mother   . Skin cancer Father   . Congestive Heart Failure Sister   . Osteoporosis Brother   . COPD Brother      Social History  Substance Use Topics  . Smoking status: Current Every Day Smoker    Packs/day: 1.50    Years: 1.00    Start date: 04/18/2015  . Smokeless tobacco: Never Used  . Alcohol use 0.5 - 1.0 oz/week    1 - 2 Standard drinks or equivalent per week   Review of Systems  Constitutional: Negative for unexpected weight change.  Respiratory: Negative for shortness of breath.   Cardiovascular: Negative for chest pain.  Gastrointestinal: Negative for blood in stool.  Genitourinary: Negative for  hematuria.  Psychiatric/Behavioral: Negative for dysphoric mood.    Objective:   Vitals:   06/17/16 1137  BP: 110/62  Pulse: 73  Resp: 16  Temp: 98 F (36.7 C)  TempSrc: Oral  SpO2: 93%  Weight: 170 lb 3 oz (77.2 kg)  Height: 5\' 9"  (1.753 m)   Body mass index is 25.13 kg/m. Wt Readings from Last 3 Encounters:  06/17/16 170 lb 3 oz (77.2 kg)  05/15/16 171 lb (77.6 kg)  02/07/14 159 lb (72.1 kg)   Physical Exam  Constitutional: He appears well-developed and well-nourished. No distress.  HENT:  Head: Normocephalic and atraumatic.  Nose: Nose normal.  Mouth/Throat: Oropharynx is  clear and moist.  Eyes: EOM are normal. No scleral icterus.  Neck: No JVD present. Carotid bruit is not present. No thyromegaly present.  Cardiovascular: Normal rate, regular rhythm and normal heart sounds.   Pulmonary/Chest: Effort normal and breath sounds normal. No respiratory distress. He has no wheezes. He has no rales.  Abdominal: Soft. Bowel sounds are normal. He exhibits no distension. There is no tenderness. There is no guarding.  Musculoskeletal: Normal range of motion. He exhibits no edema.  Lymphadenopathy:    He has no cervical adenopathy.  Neurological: He is alert. He displays normal reflexes. He exhibits normal muscle tone. Coordination normal.  Reflex Scores:      Patellar reflexes are 1+ on the right side and 1+ on the left side. Skin: Skin is warm and dry. No rash noted. He is not diaphoretic. No erythema. No pallor.  Papular lesion left side nose with telangiectasia  Psychiatric: He has a normal mood and affect. His behavior is normal. Judgment and thought content normal. His mood appears not anxious. He does not exhibit a depressed mood.   Assessment/Plan:   Problem List Items Addressed This Visit      Cardiovascular and Mediastinum   Coronary artery disease (Chronic)    Showed cross-sectional model of artery with atherosclerosis at different stages; if we can get LDL under 70, we may be able to reverse plaque build-up; also discussed use of daily aspirin to help lessen chance of thrombus formation by platelet aggregation and roughened endothelium; he will start back on the aspirin and f/u with his cardiologist; we discussed statin use; he does not want to try another statin at this time; he wants to try TLC and have cardiologist check lipids in January       Aortic ectasia, abdominal (HCC) (Chronic)     Musculoskeletal and Integument   Osteoporosis (Chronic)    Encouraged pt to have the DEXA scan done soon; number given so he can schedule at his convenience       Neoplasm of uncertain behavior of skin of face    encoruaged patient to see his dermatologist        Other   Tobacco abuse    Discussed role of tobacco use in atherosclerosis and AAA; urged him to consider quitting, pick a quit date, great American smoke-out in November, or someone's birthday as a special occasion, or even day after Halloween (smoking is a scary thing); we discussed this but he does not sound extremely motivated to quit; gave him QUIT-NOW number and information on smoking cessation class at the hospital if being with other smokers would be helpful      Gout (Chronic)    Try to limit [place], gravies, organ meats; try tart cherry juice      Encounter for follow-up surveillance of prostate cancer  Order PSA      Relevant Orders   PSA, Total and Free   Annual physical exam    USPSTF grade A and B recommendations reviewed with patient; age-appropriate recommendations, preventive care, screening tests, etc discussed and encouraged; healthy living encouraged; see AVS for patient education given to patient       Other Visit Diagnoses   None.     No orders of the defined types were placed in this encounter.  Orders Placed This Encounter  Procedures  . PSA, Total and Free    Follow up plan: Return in about 1 year (around 06/17/2017) for complete physical.  An After Visit Summary was printed and given to the patient.

## 2016-06-18 LAB — PSA

## 2016-07-29 DIAGNOSIS — L812 Freckles: Secondary | ICD-10-CM | POA: Diagnosis not present

## 2016-07-29 DIAGNOSIS — D1800 Hemangioma unspecified site: Secondary | ICD-10-CM | POA: Diagnosis not present

## 2016-07-29 DIAGNOSIS — D229 Melanocytic nevi, unspecified: Secondary | ICD-10-CM | POA: Diagnosis not present

## 2016-07-29 DIAGNOSIS — L570 Actinic keratosis: Secondary | ICD-10-CM | POA: Diagnosis not present

## 2016-07-29 DIAGNOSIS — L905 Scar conditions and fibrosis of skin: Secondary | ICD-10-CM | POA: Diagnosis not present

## 2016-07-29 DIAGNOSIS — Z1283 Encounter for screening for malignant neoplasm of skin: Secondary | ICD-10-CM | POA: Diagnosis not present

## 2016-07-29 DIAGNOSIS — L718 Other rosacea: Secondary | ICD-10-CM | POA: Diagnosis not present

## 2016-07-29 DIAGNOSIS — L720 Epidermal cyst: Secondary | ICD-10-CM | POA: Diagnosis not present

## 2016-07-29 DIAGNOSIS — Z85828 Personal history of other malignant neoplasm of skin: Secondary | ICD-10-CM | POA: Diagnosis not present

## 2016-07-29 DIAGNOSIS — C44311 Basal cell carcinoma of skin of nose: Secondary | ICD-10-CM | POA: Diagnosis not present

## 2016-07-29 DIAGNOSIS — L578 Other skin changes due to chronic exposure to nonionizing radiation: Secondary | ICD-10-CM | POA: Diagnosis not present

## 2016-07-29 DIAGNOSIS — L821 Other seborrheic keratosis: Secondary | ICD-10-CM | POA: Diagnosis not present

## 2016-08-30 DIAGNOSIS — Z86010 Personal history of colonic polyps: Secondary | ICD-10-CM | POA: Diagnosis not present

## 2016-09-09 DIAGNOSIS — I447 Left bundle-branch block, unspecified: Secondary | ICD-10-CM | POA: Diagnosis not present

## 2016-09-09 DIAGNOSIS — I519 Heart disease, unspecified: Secondary | ICD-10-CM | POA: Diagnosis not present

## 2016-09-09 DIAGNOSIS — I2510 Atherosclerotic heart disease of native coronary artery without angina pectoris: Secondary | ICD-10-CM | POA: Diagnosis not present

## 2016-09-09 DIAGNOSIS — E782 Mixed hyperlipidemia: Secondary | ICD-10-CM | POA: Diagnosis not present

## 2016-10-30 DIAGNOSIS — L820 Inflamed seborrheic keratosis: Secondary | ICD-10-CM | POA: Diagnosis not present

## 2016-10-30 DIAGNOSIS — L578 Other skin changes due to chronic exposure to nonionizing radiation: Secondary | ICD-10-CM | POA: Diagnosis not present

## 2016-10-30 DIAGNOSIS — L720 Epidermal cyst: Secondary | ICD-10-CM | POA: Diagnosis not present

## 2016-10-30 DIAGNOSIS — Z85828 Personal history of other malignant neoplasm of skin: Secondary | ICD-10-CM | POA: Diagnosis not present

## 2016-10-30 DIAGNOSIS — L570 Actinic keratosis: Secondary | ICD-10-CM | POA: Diagnosis not present

## 2016-10-30 DIAGNOSIS — L718 Other rosacea: Secondary | ICD-10-CM | POA: Diagnosis not present

## 2016-10-30 DIAGNOSIS — L821 Other seborrheic keratosis: Secondary | ICD-10-CM | POA: Diagnosis not present

## 2016-11-21 ENCOUNTER — Encounter: Payer: Self-pay | Admitting: *Deleted

## 2016-11-22 ENCOUNTER — Ambulatory Visit
Admission: RE | Admit: 2016-11-22 | Discharge: 2016-11-22 | Disposition: A | Discharge status: Home / Self Care | Payer: Medicare Other | Source: Ambulatory Visit | Attending: Unknown Physician Specialty | Admitting: Unknown Physician Specialty

## 2016-11-22 ENCOUNTER — Encounter
Admission: RE | Disposition: A | Discharge status: Home / Self Care | Payer: Self-pay | Source: Ambulatory Visit | Attending: Unknown Physician Specialty

## 2016-11-22 ENCOUNTER — Ambulatory Visit: Payer: Medicare Other | Admitting: Anesthesiology

## 2016-11-22 DIAGNOSIS — K640 First degree hemorrhoids: Secondary | ICD-10-CM | POA: Insufficient documentation

## 2016-11-22 DIAGNOSIS — Z79899 Other long term (current) drug therapy: Secondary | ICD-10-CM | POA: Diagnosis not present

## 2016-11-22 DIAGNOSIS — Z1211 Encounter for screening for malignant neoplasm of colon: Secondary | ICD-10-CM | POA: Diagnosis not present

## 2016-11-22 DIAGNOSIS — Z86010 Personal history of colonic polyps: Secondary | ICD-10-CM | POA: Diagnosis not present

## 2016-11-22 DIAGNOSIS — M109 Gout, unspecified: Secondary | ICD-10-CM | POA: Insufficient documentation

## 2016-11-22 DIAGNOSIS — Z7982 Long term (current) use of aspirin: Secondary | ICD-10-CM | POA: Insufficient documentation

## 2016-11-22 DIAGNOSIS — D125 Benign neoplasm of sigmoid colon: Secondary | ICD-10-CM | POA: Insufficient documentation

## 2016-11-22 DIAGNOSIS — Z85828 Personal history of other malignant neoplasm of skin: Secondary | ICD-10-CM | POA: Insufficient documentation

## 2016-11-22 DIAGNOSIS — Z87891 Personal history of nicotine dependence: Secondary | ICD-10-CM | POA: Diagnosis not present

## 2016-11-22 DIAGNOSIS — I10 Essential (primary) hypertension: Secondary | ICD-10-CM | POA: Diagnosis not present

## 2016-11-22 DIAGNOSIS — D124 Benign neoplasm of descending colon: Secondary | ICD-10-CM | POA: Insufficient documentation

## 2016-11-22 DIAGNOSIS — K621 Rectal polyp: Secondary | ICD-10-CM | POA: Diagnosis not present

## 2016-11-22 DIAGNOSIS — I739 Peripheral vascular disease, unspecified: Secondary | ICD-10-CM | POA: Insufficient documentation

## 2016-11-22 DIAGNOSIS — I2510 Atherosclerotic heart disease of native coronary artery without angina pectoris: Secondary | ICD-10-CM | POA: Diagnosis not present

## 2016-11-22 DIAGNOSIS — K635 Polyp of colon: Secondary | ICD-10-CM | POA: Diagnosis not present

## 2016-11-22 DIAGNOSIS — Z8546 Personal history of malignant neoplasm of prostate: Secondary | ICD-10-CM | POA: Insufficient documentation

## 2016-11-22 DIAGNOSIS — D128 Benign neoplasm of rectum: Secondary | ICD-10-CM | POA: Diagnosis not present

## 2016-11-22 HISTORY — DX: Atherosclerotic heart disease of native coronary artery without angina pectoris: I25.10

## 2016-11-22 HISTORY — PX: COLONOSCOPY WITH PROPOFOL: SHX5780

## 2016-11-22 LAB — HM COLONOSCOPY

## 2016-11-22 SURGERY — COLONOSCOPY WITH PROPOFOL
Anesthesia: General

## 2016-11-22 MED ORDER — LIDOCAINE 2% (20 MG/ML) 5 ML SYRINGE
INTRAMUSCULAR | Status: DC | PRN
  Administered 2016-11-22: 50 mg via INTRAVENOUS

## 2016-11-22 MED ORDER — SODIUM CHLORIDE 0.9 % IJ SOLN
INTRAMUSCULAR | Status: AC
  Filled 2016-11-22: qty 10

## 2016-11-22 MED ORDER — SODIUM CHLORIDE 0.9 % IV SOLN
INTRAVENOUS | Status: DC
  Administered 2016-11-22: 08:00:00 via INTRAVENOUS

## 2016-11-22 MED ORDER — PROPOFOL 500 MG/50ML IV EMUL
INTRAVENOUS | Status: AC
  Filled 2016-11-22: qty 50

## 2016-11-22 MED ORDER — SODIUM CHLORIDE 0.9 % IV SOLN: INTRAVENOUS | Status: DC

## 2016-11-22 MED ORDER — PROPOFOL 10 MG/ML IV BOLUS
INTRAVENOUS | Status: DC | PRN
  Administered 2016-11-22: 60 mg via INTRAVENOUS

## 2016-11-22 MED ORDER — PROPOFOL 500 MG/50ML IV EMUL
INTRAVENOUS | Status: DC | PRN
  Administered 2016-11-22: 150 ug/kg/min via INTRAVENOUS

## 2016-11-22 NOTE — H&P (Signed)
Primary Care Physician:  Enid Derry, MD Primary Gastroenterologist:  Dr. Vira Agar  Pre-Procedure History & Physical: HPI:  Jeff Rhodes. is a 66 y.o. male is here for an colonoscopy.   Past Medical History:  Diagnosis Date  . Cellulitis and abscess of trunk   . Coronary artery disease   . Gout 2011  . Hernia   . Malignant neoplasm of connective and other soft tissue of thorax   . Neoplasm of uncertain behavior of skin   . Other benign neoplasm of connective and other soft tissue of thorax   . Personal history of tobacco use, presenting hazards to health   . Prostate cancer ([place]) 2011  . Special screening for malignant neoplasms, colon   . Unspecified essential hypertension     Past Surgical History:  Procedure Laterality Date  . 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 The patient suffered divison of the long thoracic nreve during resection, resulting in a winged scapula.  . COLONOSCOPY  2007 and 2012   2007 done in [place]; 2012 done by Dr. Vira Agar  . INGUINAL HERNIA REPAIR Left 2010  . INGUINAL HERNIA REPAIR Right 2014  . LIPOMA EXCISION  2007 and 2012   right axilla/chest  . POLYPECTOMY  2012  . PROSTATECTOMY  April 2011  . THORACOTOMY  2012    Prior to Admission medications   Medication Sig Start Date End Date Taking? Authorizing Provider  alendronate (FOSAMAX) 70 MG tablet Take 1 tablet (70 mg total) by mouth every 7 (seven) days. Take with a full glass of water on an empty stomach. 05/15/16  Yes Arnetha Courser, MD  aspirin 81 MG chewable tablet Chew by mouth daily.   Yes Historical Provider, MD  cyclobenzaprine (FLEXERIL) 10 MG tablet Take 1 tablet (10 mg total) by mouth 3 (three) times daily as needed for muscle spasms. 05/15/16  Yes Arnetha Courser, MD  tadalafil (CIALIS) 20 MG tablet Take 0.5-1 tablets (10-20 mg total) by mouth every other day. If needed for intimacy 05/15/16  Yes Arnetha Courser, MD     Allergies as of 10/22/2016  . (No Known Allergies)    Family History  Problem Relation Age of Onset  . COPD Mother   . Hypertension Mother   . Osteoporosis Mother   . Skin cancer Father   . Congestive Heart Failure Sister   . Osteoporosis Brother   . COPD Brother     Social History   Social History  . Marital status: Married    Spouse name: N/A  . Number of children: N/A  . Years of education: N/A   Occupational History  . Not on file.   Social History Main Topics  . Smoking status: Former Smoker    Packs/day: 1.50    Years: 1.00    Start date: 04/18/2015  . Smokeless tobacco: Former Systems developer  . Alcohol use 0.5 - 1.0 oz/week    1 - 2 Standard drinks or equivalent per week  . Drug use: No  . Sexual activity: Not on file   Other Topics Concern  . Not on file   Social History Narrative  . No narrative on file    Review of Systems: See HPI, otherwise negative ROS  Physical Exam: BP (!) 112/54   Pulse 74   Temp (!) 96.8 F (36 C) (Tympanic)   Resp 16   Ht 5\' 9"  (1.753 m)   Wt 77.1 kg (170  lb)   SpO2 98%   BMI 25.10 kg/m  General:   Alert,  pleasant and cooperative in NAD Head:  Normocephalic and atraumatic. Neck:  Supple; no masses or thyromegaly. Lungs:  Clear throughout to auscultation.    Heart:  Regular rate and rhythm. Abdomen:  Soft, nontender and nondistended. Normal bowel sounds, without guarding, and without rebound.   Neurologic:  Alert and  oriented x4;  grossly normal neurologically.  Impression/Plan: Jeff Rhodes. is here for an colonoscopy to be performed for personal history of colon polyps  Risks, benefits, limitations, and alternatives regarding  colonoscopy have been reviewed with the patient.  Questions have been answered.  All parties agreeable.   Gaylyn Cheers, MD  11/22/2016, 7:21 AM

## 2016-11-22 NOTE — Op Note (Signed)
[facility]enter[facility] Patient Name: Jeff Rhodes Procedure Date: 11/22/2016 7:22 AM MRN: [id number] Account #: [id number] Date of Birth: 12-27-1950 Admit Type: Outpatient Age: 66 Room: [facility] ENDO ROOM 1 Gender: Male Note Status: Finalized Procedure:            Colonoscopy Indications:          High risk colon cancer surveillance: Personal history                        of colonic polyps Providers:            Manya Silvas, MD Referring MD:         Arnetha Courser (Referring MD) Medicines:            Propofol per Anesthesia Complications:        No immediate complications. Procedure:            Pre-Anesthesia Assessment:                       - After reviewing the risks and benefits, the patient                        was deemed in satisfactory condition to undergo the                        procedure.                       After obtaining informed consent, the colonoscope was                        passed under direct vision. Throughout the procedure,                        the patient's blood pressure, pulse, and oxygen                        saturations were monitored continuously. The                        Colonoscope was introduced through the anus and                        advanced to the the cecum, identified by appendiceal                        orifice and ileocecal valve. The colonoscopy was                        performed without difficulty. The patient tolerated the                        procedure well. The quality of the bowel preparation                        was excellent. Findings:      Three sessile polyps were found in the rectum, sigmoid colon and       descending colon. The polyps were diminutive in size. These polyps were       removed with a jumbo cold forceps. Resection and retrieval were complete.  Internal hemorrhoids were found during endoscopy. The hemorrhoids were       small and Grade I (internal hemorrhoids that  do not prolapse).      The exam was otherwise without abnormality. Impression:           - Three diminutive polyps in the rectum, in the sigmoid                        colon and in the descending colon, removed with a jumbo                        cold forceps. Resected and retrieved.                       - Internal hemorrhoids.                       - The examination was otherwise normal. Recommendation:       - Await pathology results. Manya Silvas, MD 11/22/2016 8:03:08 AM This report has been signed electronically. Number of Addenda: 0 Note Initiated On: 11/22/2016 7:22 AM Scope Withdrawal Time: 0 hours 11 minutes 22 seconds  Total Procedure Duration: 0 hours 18 minutes 36 seconds       [facility]

## 2016-11-22 NOTE — Anesthesia Preprocedure Evaluation (Signed)
Anesthesia Evaluation  Patient identified by MRN, date of birth, ID band Patient awake    Reviewed: Allergy & Precautions, H&P , NPO status , Patient's Chart, lab work & pertinent test results, reviewed documented beta blocker date and time   Airway Mallampati: II   Neck ROM: full    Dental  (+) Poor Dentition   Pulmonary neg pulmonary ROS, former smoker,    Pulmonary exam normal        Cardiovascular hypertension, + Peripheral Vascular Disease  negative cardio ROS Normal cardiovascular exam Rhythm:regular Rate:Normal     Neuro/Psych negative neurological ROS  negative psych ROS   GI/Hepatic negative GI ROS, Neg liver ROS,   Endo/Other  negative endocrine ROS  Renal/GU negative Renal ROS  negative genitourinary   Musculoskeletal   Abdominal   Peds  Hematology negative hematology ROS (+)   Anesthesia Other Findings Past Medical History: No date: Cellulitis and abscess of trunk No date: Coronary artery disease 2011: Gout No date: Hernia No date: Malignant neoplasm of connective and other sof* No date: Neoplasm of uncertain behavior of skin No date: Other benign neoplasm of connective and other * No date: Personal history of tobacco use, presenting ha* 2011: Prostate cancer ([place]) No date: Special screening for malignant neoplasms, col* No date: Unspecified essential hypertension Past Surgical History: 2012: AXILLARY SURGERY Right     Comment: intrathoracic extension; 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 The patient suffered               divison of the long thoracic nreve during               resection, resulting in a winged scapula. 2007 and 2012: COLONOSCOPY     Comment: 2007 done in [place]; 2012 done by Dr.               Vira Agar 2010: [place] Left 2014: [place] Right 2007 and 2012: LIPOMA EXCISION     Comment: right axilla/chest 2012:  POLYPECTOMY April 2011: PROSTATECTOMY 2012: THORACOTOMY BMI    Body Mass Index:  25.10 kg/m     Reproductive/Obstetrics negative OB ROS                             Anesthesia Physical Anesthesia Plan  ASA: III  Anesthesia Plan: General   Post-op Pain Management:    Induction:   Airway Management Planned:   Additional Equipment:   Intra-op Plan:   Post-operative Plan:   Informed Consent: I have reviewed the patients History and Physical, chart, labs and discussed the procedure including the risks, benefits and alternatives for the proposed anesthesia with the patient or authorized representative who has indicated his/her understanding and acceptance.   Dental Advisory Given  Plan Discussed with: CRNA  Anesthesia Plan Comments:         Anesthesia Quick Evaluation

## 2016-11-22 NOTE — Anesthesia Post-op Follow-up Note (Cosign Needed)
Anesthesia QCDR form completed.        

## 2016-11-22 NOTE — Transfer of Care (Signed)
Immediate Anesthesia Transfer of Care Note  Patient: Jeff Rhodes.  Procedure(s) Performed: Procedure(s): COLONOSCOPY WITH PROPOFOL (N/A)  Patient Location: Endoscopy Unit  Anesthesia Type:General  Level of Consciousness: sedated  Airway & Oxygen Therapy: Patient connected to nasal cannula oxygen  Post-op Assessment: Post -op Vital signs reviewed and stable  Post vital signs: stable  Last Vitals:  Vitals:   11/22/16 0800 11/22/16 0806  BP: (!) (P) 106/53 (!) 106/53  Pulse: (P) 67 64  Resp: (P) 16 19  Temp: (P) 36.4 C 36.4 C    Last Pain:  Vitals:   11/22/16 0806  TempSrc: Tympanic         Complications: No apparent anesthesia complications

## 2016-11-25 ENCOUNTER — Encounter: Payer: Self-pay | Admitting: Unknown Physician Specialty

## 2016-11-25 NOTE — Anesthesia Postprocedure Evaluation (Signed)
Anesthesia Post Note  Patient: Jeff Rhodes.  Procedure(s) Performed: Procedure(s) (LRB): COLONOSCOPY WITH PROPOFOL (N/A)  Patient location during evaluation: PACU Anesthesia Type: General Level of consciousness: awake and alert Pain management: pain level controlled Vital Signs Assessment: post-procedure vital signs reviewed and stable Respiratory status: spontaneous breathing, nonlabored ventilation, respiratory function stable and patient connected to nasal cannula oxygen Cardiovascular status: blood pressure returned to baseline and stable Postop Assessment: no signs of nausea or vomiting Anesthetic complications: no     Last Vitals:  Vitals:   11/22/16 0830 11/22/16 0840  BP: 139/75 135/71  Pulse: 62 (!) 55  Resp: (!) 21 17  Temp:      Last Pain:  Vitals:   11/22/16 0806  TempSrc: Tympanic                 Molli Barrows

## 2016-11-26 LAB — SURGICAL PATHOLOGY

## 2016-12-31 DIAGNOSIS — D485 Neoplasm of uncertain behavior of skin: Secondary | ICD-10-CM | POA: Diagnosis not present

## 2016-12-31 DIAGNOSIS — D2239 Melanocytic nevi of other parts of face: Secondary | ICD-10-CM | POA: Diagnosis not present

## 2016-12-31 DIAGNOSIS — L720 Epidermal cyst: Secondary | ICD-10-CM | POA: Diagnosis not present

## 2017-01-07 DIAGNOSIS — L720 Epidermal cyst: Secondary | ICD-10-CM | POA: Diagnosis not present

## 2017-03-02 DIAGNOSIS — S2232XA Fracture of one rib, left side, initial encounter for closed fracture: Secondary | ICD-10-CM | POA: Diagnosis not present

## 2017-03-02 DIAGNOSIS — R0781 Pleurodynia: Secondary | ICD-10-CM | POA: Diagnosis not present

## 2017-03-07 ENCOUNTER — Ambulatory Visit (INDEPENDENT_AMBULATORY_CARE_PROVIDER_SITE_OTHER): Payer: Medicare Other | Admitting: Family Medicine

## 2017-03-07 ENCOUNTER — Encounter: Payer: Self-pay | Admitting: Family Medicine

## 2017-03-07 VITALS — BP 130/76 | HR 78 | Temp 97.8°F | Resp 14 | Ht 69.0 in | Wt 168.8 lb

## 2017-03-07 DIAGNOSIS — N529 Male erectile dysfunction, unspecified: Secondary | ICD-10-CM

## 2017-03-07 DIAGNOSIS — S2232XA Fracture of one rib, left side, initial encounter for closed fracture: Secondary | ICD-10-CM | POA: Diagnosis not present

## 2017-03-07 DIAGNOSIS — Z720 Tobacco use: Secondary | ICD-10-CM

## 2017-03-07 MED ORDER — HYDROCODONE-ACETAMINOPHEN 5-325 MG PO TABS: 1.0000 | ORAL_TABLET | Freq: Four times a day (QID) | ORAL | 0 refills | Status: DC | PRN

## 2017-03-07 MED ORDER — TADALAFIL 20 MG PO TABS: 10.0000 mg | ORAL_TABLET | ORAL | 11 refills | Status: DC

## 2017-03-07 NOTE — Patient Instructions (Addendum)
I do encourage you to quit smoking Call [phone] to sign up for smoking cessation classes You can call 1-800-QUIT-NOW to talk with a smoking cessation coach  Use the pain medicine if needed Never ever drink on the same day as you take pain medicine 10 deep breaths every hour for the next week or two until you are breathing and coughing and laughing on your own Call me or seek medical attention if any symptoms of pneumonia  Rib Fracture A rib fracture is a break or crack in one of the bones of the ribs. The ribs are like a cage that goes around your upper chest. A broken or cracked rib is often painful, but most do not cause other problems. Most rib fractures heal on their own in 1-3 months. Follow these instructions at home:  Avoid activities that cause pain to the injured area. Protect your injured area.  Slowly increase activity as told by your doctor.  Take medicine as told by your doctor.  Put ice on the injured area for the first 1-2 days after you have been treated or as told by your doctor. ? Put ice in a plastic bag. ? Place a towel between your skin and the bag. ? Leave the ice on for 15-20 minutes at a time, every 2 hours while you are awake.  Do deep breathing as told by your doctor. You may be told to: ? Take deep breaths many times a day. ? Cough many times a day while hugging a pillow. ? Use a device (incentive spirometer) to perform deep breathing many times a day.  Drink enough fluids to keep your pee (urine) clear or pale yellow.  Do not wear a rib belt or binder. These do not allow you to breathe deeply. Get help right away if:  You have a fever.  You have trouble breathing.  You cannot stop coughing.  You cough up thick or bloody spit (mucus).  You feel sick to your stomach (nauseous), throw up (vomit), or have belly (abdominal) pain.  Your pain gets worse and medicine does not help. This information is not intended to replace advice given to you by  your health care provider. Make sure you discuss any questions you have with your health care provider. Document Released: 05/28/2008 Document Revised: 01/25/2016 Document Reviewed: 10/21/2012 Elsevier Interactive Patient Education  [employer].

## 2017-03-07 NOTE — Progress Notes (Signed)
BP 130/76   Pulse 78   Temp 97.8 F (36.6 C) (Oral)   Resp 14   Ht 5\' 9"  (1.753 m)   Wt 168 lb 12.8 oz (76.6 kg)   SpO2 94%   BMI 24.93 kg/m    Subjective:    Patient ID: Jeff Millard., male    DOB: 10-Dec-1950, 66 y.o.   MRN: [id number]  HPI: Jeff Minter. is a 66 y.o. male  Chief Complaint  Patient presents with  . Rib Fracture    went to Urgent care was with the grandchildren on slip n slide  . Medication Refill    HPI Patient went to [place] walk in clinic after injuring himself on June 23rd; was on slip and slide First few nights pretty painful, then started to get worse again Left side Did not hear it crack but felt it right away Never bruised No SHOB or trouble breathing Hurts to laugh or cough Jeff Rhodes doctor gave him tramadol for pain He also has flexeril left over from back issues, and used that at first; [facility] powders too utnil he went to urgent care Kinds of helps but not all the way down He does drink alcohol, but agrees to stop drinking alcohol completely while on pain medicine ------------------------------------------------------------------ Plain film left ribs / chest:   Nondisplaced fracture of the fifth rib on the left ----------------------------------------------------------------- Smoking  Just quit cold [place] and quit for 9 years; moment of weakness and picked up  He has ED and requests refill of ED medicine; works well  Depression screen [facility] 2/9 03/07/2017 06/17/2016 05/15/2016  Decreased Interest 0 0 0  Down, Depressed, Hopeless 0 0 0  PHQ - 2 Score 0 0 0    Relevant past medical, surgical, family and social history reviewed Past Medical History:  Diagnosis Date  . Cellulitis and abscess of trunk   . Coronary artery disease   . Gout 2011  . Hernia   . Malignant neoplasm of connective and other soft tissue of thorax   . Neoplasm of uncertain behavior of skin   . Other benign neoplasm of connective and other soft tissue  of thorax   . Personal history of tobacco use, presenting hazards to health   . Prostate cancer ([place]) 2011  . Rib fracture   . Special screening for malignant neoplasms, colon   . Unspecified essential hypertension    Past Surgical History:  Procedure Laterality Date  . 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 The patient suffered divison of the long thoracic nreve during resection, resulting in a winged scapula.  . COLONOSCOPY  2007 and 2012   2007 done in [place]; 2012 done by Dr. Vira Agar  . COLONOSCOPY WITH PROPOFOL N/A 11/22/2016   Procedure: COLONOSCOPY WITH PROPOFOL;  Surgeon: Manya Silvas, MD;  Location: [facility] ENDOSCOPY;  Service: Endoscopy;  Laterality: N/A;  . INGUINAL HERNIA REPAIR Left 2010  . INGUINAL HERNIA REPAIR Right 2014  . LIPOMA EXCISION  2007 and 2012   right axilla/chest  . POLYPECTOMY  2012  . PROSTATECTOMY  April 2011  . THORACOTOMY  2012   Family History  Problem Relation Age of Onset  . COPD Mother   . Hypertension Mother   . Osteoporosis Mother   . Skin cancer Father   . Congestive Heart Failure Sister   . Osteoporosis Brother   . COPD Brother    Social History   Social History  .  Marital status: Married    Spouse name: N/A  . Number of children: N/A  . Years of education: N/A   Occupational History  . Not on file.   Social History Main Topics  . Smoking status: Current Every Day Smoker    Packs/day: 1.50    Years: 1.00    Start date: 04/18/2015  . Smokeless tobacco: Former Systems developer  . Alcohol use 2.4 - 4.2 oz/week    1 - 2 Glasses of wine, 2 - 3 Shots of liquor, 1 - 2 Standard drinks or equivalent per week     Comment: liquor 2-3x weekly wine 1-2 glasses a day  . Drug use: No  . Sexual activity: Yes   Other Topics Concern  . Not on file   Social History Narrative  . No narrative on file    Interim medical history since last visit reviewed. Allergies and medications  reviewed  Review of Systems Per HPI unless specifically indicated above     Objective:    BP 130/76   Pulse 78   Temp 97.8 F (36.6 C) (Oral)   Resp 14   Ht 5\' 9"  (1.753 m)   Wt 168 lb 12.8 oz (76.6 kg)   SpO2 94%   BMI 24.93 kg/m   Wt Readings from Last 3 Encounters:  03/07/17 168 lb 12.8 oz (76.6 kg)  11/22/16 170 lb (77.1 kg)  06/17/16 170 lb 3 oz (77.2 kg)    Physical Exam  Constitutional: He appears well-developed and well-nourished. No distress.  Cardiovascular: Normal rate and regular rhythm.   Pulmonary/Chest: Effort normal and breath sounds normal. He has no wheezes. He exhibits tenderness, bony tenderness and swelling (mild). He exhibits no crepitus, no edema, no deformity and no retraction.  Tenderness over the 5th rib on the LEFT anteriorly  Neurological: He displays no tremor.  Psychiatric: His mood appears not anxious. He does not exhibit a depressed mood.    Results for orders placed or performed in visit on 11/28/16  HM COLONOSCOPY  Result Value Ref Range   HM Colonoscopy See Report (in chart) See Report (in chart), Patient Reported      Assessment & Plan:   Problem List Items Addressed This Visit      Genitourinary   Erectile dysfunction    Rx provided        Other   Tobacco abuse    Encouragement given for stopping his cigarettes; I am here to help if/when ready to quit       Other Visit Diagnoses    Closed fracture of one rib of left side, initial encounter    -  Primary   discussed natural course; Rx for pain med given (Rx should have said every FOUR hours, not six); reviewed [place] [facility] web site 1st; NO EtOH     discussed risk of unintentional overdose, death if combining alcohol plus pain medicines; he agrees to completely abstain from alcohol while taking any pain medicines  Follow up plan: No Follow-up on file.  An after-visit summary was printed and given to the patient at [place].  Please see the patient instructions which may  contain other information and recommendations beyond what is mentioned above in the assessment and plan.  Meds ordered this encounter  Medications  . DISCONTD: traMADol (ULTRAM) 50 MG tablet    Sig: Take 50 mg by mouth 3 (three) times daily as needed.  Marland Kitchen HYDROcodone-acetaminophen (NORCO/VICODIN) 5-325 MG tablet    Sig: Take 1 tablet by mouth  every 6 (six) hours as needed for moderate pain.    Dispense:  30 tablet    Refill:  0    [facility] web site checked, no red flags, acute pain  . tadalafil (CIALIS) 20 MG tablet    Sig: Take 0.5-1 tablets (10-20 mg total) by mouth every other day. If needed for intimacy    Dispense:  15 tablet    Refill:  11   No orders of the defined types were placed in this encounter.  MD note: I meant for the hydrocodone Rx to say one every four hours if needed, not every six hours Thirty pills would have been an appropriate five day supply for acute pain Reviewed [facility] web site over previous 12 months; no unexpected prescriptions; no red flags; no concern for misuse, diversion, or doctor shopping

## 2017-03-12 ENCOUNTER — Other Ambulatory Visit: Payer: Self-pay

## 2017-03-12 MED ORDER — SILDENAFIL CITRATE 100 MG PO TABS: 50.0000 mg | ORAL_TABLET | Freq: Every day | ORAL | 3 refills | Status: DC | PRN

## 2017-03-13 ENCOUNTER — Encounter: Payer: Self-pay | Admitting: Family Medicine

## 2017-03-13 DIAGNOSIS — N529 Male erectile dysfunction, unspecified: Secondary | ICD-10-CM | POA: Insufficient documentation

## 2017-03-13 NOTE — Assessment & Plan Note (Signed)
Encouragement given for stopping his cigarettes; I am here to help if/when ready to quit

## 2017-03-13 NOTE — Assessment & Plan Note (Signed)
Rx provided.

## 2017-06-19 ENCOUNTER — Ambulatory Visit (INDEPENDENT_AMBULATORY_CARE_PROVIDER_SITE_OTHER): Payer: Medicare Other | Admitting: Family Medicine

## 2017-06-19 ENCOUNTER — Encounter: Payer: Self-pay | Admitting: Family Medicine

## 2017-06-19 VITALS — BP 98/56 | HR 61 | Temp 97.4°F | Resp 14 | Ht 69.0 in | Wt 162.6 lb

## 2017-06-19 DIAGNOSIS — Z5181 Encounter for therapeutic drug level monitoring: Secondary | ICD-10-CM

## 2017-06-19 DIAGNOSIS — Z0000 Encounter for general adult medical examination without abnormal findings: Secondary | ICD-10-CM | POA: Diagnosis not present

## 2017-06-19 DIAGNOSIS — Z23 Encounter for immunization: Secondary | ICD-10-CM | POA: Diagnosis not present

## 2017-06-19 DIAGNOSIS — Z789 Other specified health status: Secondary | ICD-10-CM

## 2017-06-19 DIAGNOSIS — M810 Age-related osteoporosis without current pathological fracture: Secondary | ICD-10-CM | POA: Diagnosis not present

## 2017-06-19 DIAGNOSIS — Z8546 Personal history of malignant neoplasm of prostate: Secondary | ICD-10-CM | POA: Diagnosis not present

## 2017-06-19 DIAGNOSIS — Z1159 Encounter for screening for other viral diseases: Secondary | ICD-10-CM | POA: Diagnosis not present

## 2017-06-19 DIAGNOSIS — D489 Neoplasm of uncertain behavior, unspecified: Secondary | ICD-10-CM | POA: Diagnosis not present

## 2017-06-19 DIAGNOSIS — Z720 Tobacco use: Secondary | ICD-10-CM

## 2017-06-19 DIAGNOSIS — E785 Hyperlipidemia, unspecified: Secondary | ICD-10-CM | POA: Insufficient documentation

## 2017-06-19 DIAGNOSIS — Z87891 Personal history of nicotine dependence: Secondary | ICD-10-CM | POA: Insufficient documentation
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
## 2017-06-19 NOTE — Assessment & Plan Note (Signed)
Hx of prostate cancer, check PSA

## 2017-06-19 NOTE — Assessment & Plan Note (Signed)
Discussed, offered

## 2017-06-19 NOTE — Assessment & Plan Note (Signed)
Discussed risk of lung cancer, order chest CT for screening purposes

## 2017-06-19 NOTE — Patient Instructions (Addendum)
You will be due for a follow-up ultrasound of your aorta in June 2020 I'll suggest unsweetened almond milk for calcium Please do call to schedule your bone density study; the number to schedule one at either [facility] or [facility] is [phone] or [phone] Ask your pharmacist about Shingrix, the new shingles vaccine Let's get labs I do encourage you to quit smoking Call [phone] to sign up for smoking cessation classes You can call 1-800-QUIT-NOW to talk with a smoking cessation coach  Health Maintenance, Male A healthy lifestyle and preventive care is important for your health and wellness. Ask your health care provider about what schedule of regular examinations is right for you. What should I know about weight and diet? Eat a Healthy Diet  Eat plenty of vegetables, fruits, whole grains, low-fat dairy products, and lean protein.  Do not eat a lot of foods high in solid fats, added sugars, or salt.  Maintain a Healthy Weight Regular exercise can help you achieve or maintain a healthy weight. You should:  Do at least 150 minutes of exercise each week. The exercise should increase your heart rate and make you sweat (moderate-intensity exercise).  Do strength-training exercises at least twice a week.  Watch Your Levels of Cholesterol and Blood Lipids  Have your blood tested for lipids and cholesterol every 5 years starting at 68 years of age. If you are at high risk for heart disease, you should start having your blood tested when you are 68 years old. You may need to have your cholesterol levels checked more often if: ? Your lipid or cholesterol levels are high. ? You are older than 68 years of age. ? You are at high risk for heart disease.  What should I know about cancer screening? Many types of cancers can be detected early and may often be prevented. Lung Cancer  You should be screened every year for lung cancer if: ? You are a  current smoker who has smoked for at least 30 years. ? You are a former smoker who has quit within the past 15 years.  Talk to your health care provider about your screening options, when you should start screening, and how often you should be screened.  Colorectal Cancer  Routine colorectal cancer screening usually begins at 68 years of age and should be repeated every 5-10 years until you are 68 years old. You may need to be screened more often if early forms of precancerous polyps or small growths are found. Your health care provider may recommend screening at an earlier age if you have risk factors for colon cancer.  Your health care provider may recommend using home test kits to check for hidden blood in the stool.  A small camera at the end of a tube can be used to examine your colon (sigmoidoscopy or colonoscopy). This checks for the earliest forms of colorectal cancer.  Prostate and Testicular Cancer  Depending on your age and overall health, your health care provider may do certain tests to screen for prostate and testicular cancer.  Talk to your health care provider about any symptoms or concerns you have about testicular or prostate cancer.  Skin Cancer  Check your skin from head to toe regularly.  Tell your health care provider about any new moles or changes in moles, especially if: ? There is a change in a mole's size, shape, or color. ? You have a mole that is larger than a pencil eraser.  Always use sunscreen. Apply sunscreen liberally and repeat throughout the day.  Protect yourself by wearing long sleeves, pants, a wide-brimmed hat, and sunglasses when outside.  What should I know about heart disease, diabetes, and high blood pressure?  If you are 43-69 years of age, have your blood pressure checked every 3-5 years. If you are 68 years of age or older, have your blood pressure checked every year. You should have your blood pressure measured twice-once when you are at  a hospital or clinic, and once when you are not at a hospital or clinic. Record the average of the two measurements. To check your blood pressure when you are not at a hospital or clinic, you can use: ? An automated blood pressure machine at a pharmacy. ? A home blood pressure monitor.  Talk to your health care provider about your target blood pressure.  If you are between 20-54 years old, ask your health care provider if you should take aspirin to prevent heart disease.  Have regular diabetes screenings by checking your fasting blood sugar level. ? If you are at a normal weight and have a low risk for diabetes, have this test once every three years after the age of 4. ? If you are overweight and have a high risk for diabetes, consider being tested at a younger age or more often.  A one-time screening for abdominal aortic aneurysm (AAA) by ultrasound is recommended for men aged 40-75 years who are current or former smokers. What should I know about preventing infection? Hepatitis B If you have a higher risk for hepatitis B, you should be screened for this virus. Talk with your health care provider to find out if you are at risk for hepatitis B infection. Hepatitis C Blood testing is recommended for:  Everyone born from [birth year] through 1967.  Anyone with known risk factors for hepatitis C.  Sexually Transmitted Diseases (STDs)  You should be screened each year for STDs including gonorrhea and chlamydia if: ? You are sexually active and are younger than 67 years of age. ? You are older than 68 years of age and your health care provider tells you that you are at risk for this type of infection. ? Your sexual activity has changed since you were last screened and you are at an increased risk for chlamydia or gonorrhea. Ask your health care provider if you are at risk.  Talk with your health care provider about whether you are at high risk of being infected with HIV. Your health care provider  may recommend a prescription medicine to help prevent HIV infection.  What else can I do?  Schedule regular health, dental, and eye exams.  Stay current with your vaccines (immunizations).  Do not use any tobacco products, such as cigarettes, chewing tobacco, and e-cigarettes. If you need help quitting, ask your health care provider.  Limit alcohol intake to no more than 2 drinks per day. One drink equals 12 ounces of beer, 5 ounces of wine, or 1 ounces of hard liquor.  Do not use street drugs.  Do not share needles.  Ask your health care provider for help if you need support or information about quitting drugs.  Tell your health care provider if you often feel depressed.  Tell your health care provider if you have ever been abused or do not feel safe at home. This information is not intended to replace advice given to you by your health care provider. Make sure you discuss any  questions you have with your health care provider. Document Released: 02/15/2008 Document Revised: 04/17/2016 Document Reviewed: 05/23/2015 Elsevier Interactive Patient Education  [employer].

## 2017-06-19 NOTE — Assessment & Plan Note (Signed)
Full code if collapses, but would not want to just be kept alive if persistent vegetative state

## 2017-06-19 NOTE — Assessment & Plan Note (Signed)
USPSTF grade A and B recommendations reviewed with patient; age-appropriate recommendations, preventive care, screening tests, etc discussed and encouraged; healthy living encouraged; see AVS for patient education given to patient  

## 2017-06-19 NOTE — Assessment & Plan Note (Signed)
Check DEXA 

## 2017-06-19 NOTE — Assessment & Plan Note (Signed)
Check lipids today, truly fasting

## 2017-06-20 LAB — COMPLETE METABOLIC PANEL WITH GFR
AG RATIO: 1.6 (calc) (ref 1.0–2.5)
ALBUMIN MSPROF: 3.9 g/dL (ref 3.6–5.1)
ALT: 10 U/L (ref 9–46)
AST: 13 U/L (ref 10–35)
Alkaline phosphatase (APISO): 78 U/L (ref 40–115)
BUN: 8 mg/dL (ref 7–25)
CALCIUM: 9.1 mg/dL (ref 8.6–10.3)
CO2: 26 mmol/L (ref 20–32)
Chloride: 105 mmol/L (ref 98–110)
Creat: 0.96 mg/dL (ref 0.70–1.25)
GFR, EST AFRICAN AMERICAN: 94 mL/min/{1.73_m2} (ref >=60)
GFR, EST NON AFRICAN AMERICAN: 81 mL/min/{1.73_m2} (ref >=60)
GLOBULIN: 2.4 g/dL (ref 1.9–3.7)
Glucose, Bld: 73 mg/dL (ref 65–99)
POTASSIUM: 4.3 mmol/L (ref 3.5–5.3)
SODIUM: 140 mmol/L (ref 135–146)
TOTAL PROTEIN: 6.3 g/dL (ref 6.1–8.1)
Total Bilirubin: 0.7 mg/dL (ref 0.2–1.2)

## 2017-06-20 LAB — PSA, TOTAL AND FREE
PSA, % FREE: UNDETERMINED % (ref >25)
PSA, Free: <0.1 ng/mL

## 2017-06-20 LAB — HEPATITIS C ANTIBODY
HEP C AB: NONREACTIVE
SIGNAL TO CUT-OFF: 0.02 (ref <1.00)

## 2017-06-20 LAB — LIPID PANEL
CHOL/HDL RATIO: 3.3 (calc) (ref <5.0)
CHOLESTEROL: 224 mg/dL — AB (ref <200)
HDL: 68 mg/dL (ref >40)
LDL Cholesterol (Calc): 134 mg/dL (calc) — ABNORMAL HIGH
Non-HDL Cholesterol (Calc): 156 mg/dL (calc) — ABNORMAL HIGH (ref <130)
Triglycerides: 108 mg/dL (ref <150)

## 2017-06-23 ENCOUNTER — Telehealth: Payer: Self-pay | Admitting: *Deleted

## 2017-06-23 DIAGNOSIS — Z87891 Personal history of nicotine dependence: Secondary | ICD-10-CM

## 2017-06-23 DIAGNOSIS — Z122 Encounter for screening for malignant neoplasm of respiratory organs: Secondary | ICD-10-CM

## 2017-06-23 NOTE — Telephone Encounter (Signed)
Received referral for initial lung cancer screening scan. Contacted patient and obtained smoking history,(current, 30 pack year) as well as answering questions related to screening process. Patient denies signs of lung cancer such as weight loss or hemoptysis. Patient denies comorbidity that would prevent curative treatment if lung cancer were found. Patient is scheduled for shared decision making visit and CT scan on 07/09/17.

## 2017-07-09 ENCOUNTER — Encounter: Payer: Self-pay | Admitting: Nurse Practitioner

## 2017-07-09 ENCOUNTER — Inpatient Hospital Stay: Payer: Medicare Other | Admitting: Oncology

## 2017-07-09 ENCOUNTER — Ambulatory Visit: Admission: RE | Admit: 2017-07-09 | Payer: Medicare Other | Source: Ambulatory Visit

## 2017-07-10 ENCOUNTER — Telehealth: Payer: Self-pay | Admitting: *Deleted

## 2017-07-10 DIAGNOSIS — C4441 Basal cell carcinoma of skin of scalp and neck: Secondary | ICD-10-CM | POA: Diagnosis not present

## 2017-07-10 DIAGNOSIS — L570 Actinic keratosis: Secondary | ICD-10-CM | POA: Diagnosis not present

## 2017-07-10 DIAGNOSIS — Z1283 Encounter for screening for malignant neoplasm of skin: Secondary | ICD-10-CM | POA: Diagnosis not present

## 2017-07-10 DIAGNOSIS — D485 Neoplasm of uncertain behavior of skin: Secondary | ICD-10-CM | POA: Diagnosis not present

## 2017-07-10 DIAGNOSIS — L812 Freckles: Secondary | ICD-10-CM | POA: Diagnosis not present

## 2017-07-10 DIAGNOSIS — D1800 Hemangioma unspecified site: Secondary | ICD-10-CM | POA: Diagnosis not present

## 2017-07-10 DIAGNOSIS — D229 Melanocytic nevi, unspecified: Secondary | ICD-10-CM | POA: Diagnosis not present

## 2017-07-10 DIAGNOSIS — L432 Lichenoid drug reaction: Secondary | ICD-10-CM | POA: Diagnosis not present

## 2017-07-10 DIAGNOSIS — L578 Other skin changes due to chronic exposure to nonionizing radiation: Secondary | ICD-10-CM | POA: Diagnosis not present

## 2017-07-10 DIAGNOSIS — Z85828 Personal history of other malignant neoplasm of skin: Secondary | ICD-10-CM | POA: Diagnosis not present

## 2017-07-10 DIAGNOSIS — L821 Other seborrheic keratosis: Secondary | ICD-10-CM | POA: Diagnosis not present

## 2017-07-10 DIAGNOSIS — L820 Inflamed seborrheic keratosis: Secondary | ICD-10-CM | POA: Diagnosis not present

## 2017-07-10 DIAGNOSIS — C44519 Basal cell carcinoma of skin of other part of trunk: Secondary | ICD-10-CM | POA: Diagnosis not present

## 2017-07-10 NOTE — Telephone Encounter (Signed)
Patient is agreeable for lung screening appt to be rescheduled to 07/15/17 at 2pm. Please see prior note regarding eligibility information.

## 2017-07-15 ENCOUNTER — Inpatient Hospital Stay: Payer: Medicare Other | Attending: Nurse Practitioner | Admitting: Nurse Practitioner

## 2017-07-15 ENCOUNTER — Ambulatory Visit
Admission: RE | Admit: 2017-07-15 | Discharge: 2017-07-15 | Disposition: A | Discharge status: Home / Self Care | Payer: Medicare Other | Source: Ambulatory Visit | Attending: Nurse Practitioner | Admitting: Nurse Practitioner

## 2017-07-15 DIAGNOSIS — F17210 Nicotine dependence, cigarettes, uncomplicated: Secondary | ICD-10-CM | POA: Diagnosis not present

## 2017-07-15 DIAGNOSIS — Z87891 Personal history of nicotine dependence: Secondary | ICD-10-CM | POA: Insufficient documentation

## 2017-07-15 DIAGNOSIS — I700 Atherosclerosis of aorta: Secondary | ICD-10-CM | POA: Diagnosis not present

## 2017-07-15 DIAGNOSIS — R911 Solitary pulmonary nodule: Secondary | ICD-10-CM | POA: Diagnosis not present

## 2017-07-15 DIAGNOSIS — Z122 Encounter for screening for malignant neoplasm of respiratory organs: Secondary | ICD-10-CM

## 2017-07-15 DIAGNOSIS — I2510 Atherosclerotic heart disease of native coronary artery without angina pectoris: Secondary | ICD-10-CM | POA: Insufficient documentation

## 2017-07-16 NOTE — Progress Notes (Signed)
In accordance with CMS guidelines, patient has met eligibility criteria including age, absence of signs or symptoms of lung cancer.  Social History   Tobacco Use  . Smoking status: Current Every Day Smoker    Packs/day: 0.75    Years: 40.00    Pack years: 30.00    Start date: 04/18/2015  . Smokeless tobacco: Former Network engineer Use Topics  . Alcohol use: Yes    Alcohol/week: 2.4 - 4.2 oz    Types: 1 - 2 Glasses of wine, 2 - 3 Shots of liquor, 1 - 2 Standard drinks or equivalent per week    Comment: liquor 2-3x weekly wine 1-2 glasses a day  . Drug use: No     A shared decision-making session was conducted prior to the performance of CT scan. This includes one or more decision aids, includes benefits and harms of screening, follow-up diagnostic testing, over-diagnosis, false positive rate, and total radiation exposure.  Counseling on the importance of adherence to annual lung cancer LDCT screening, impact of co-morbidities, and ability or willingness to undergo diagnosis and treatment is imperative for compliance of the program.  Counseling on the importance of continued smoking cessation for former smokers; the importance of smoking cessation for current smokers, and information about tobacco cessation interventions have been given to patient including [place] and 1800 quit [place] programs.  Written order for lung cancer screening with LDCT has been given to the patient and any and all questions have been answered to the best of my abilities.   Yearly follow up will be coordinated by Burgess Estelle, Thoracic Navigator.  Beckey Rutter, DNP, AGNP-C 07/16/17 3:50 PM

## 2017-07-17 ENCOUNTER — Other Ambulatory Visit: Payer: Self-pay | Admitting: *Deleted

## 2017-07-17 ENCOUNTER — Telehealth: Payer: Self-pay | Admitting: *Deleted

## 2017-07-17 DIAGNOSIS — R918 Other nonspecific abnormal finding of lung field: Secondary | ICD-10-CM

## 2017-07-17 NOTE — Telephone Encounter (Signed)
Discussed results of lung screening scan with multidisciplinary conference agreement of referral to Dr. Nestor Lewandowsky for consideration of surgery. Patient in agreement with plan of care. Will coordinate PFTs and referral to Dr. Genevive Bi.

## 2017-07-17 NOTE — Progress Notes (Signed)
amb  

## 2017-07-28 DIAGNOSIS — I447 Left bundle-branch block, unspecified: Secondary | ICD-10-CM | POA: Insufficient documentation

## 2017-07-28 DIAGNOSIS — I519 Heart disease, unspecified: Secondary | ICD-10-CM | POA: Insufficient documentation

## 2017-08-01 ENCOUNTER — Telehealth: Payer: Self-pay

## 2017-08-01 ENCOUNTER — Other Ambulatory Visit: Payer: Self-pay

## 2017-08-01 ENCOUNTER — Encounter: Payer: Self-pay | Admitting: Cardiothoracic Surgery

## 2017-08-01 ENCOUNTER — Ambulatory Visit (INDEPENDENT_AMBULATORY_CARE_PROVIDER_SITE_OTHER): Payer: Medicare Other | Admitting: Cardiothoracic Surgery

## 2017-08-01 VITALS — BP 120/76 | HR 73 | Ht 69.0 in | Wt 161.0 lb

## 2017-08-01 DIAGNOSIS — R918 Other nonspecific abnormal finding of lung field: Secondary | ICD-10-CM

## 2017-08-01 DIAGNOSIS — R911 Solitary pulmonary nodule: Secondary | ICD-10-CM | POA: Diagnosis not present

## 2017-08-01 NOTE — Patient Instructions (Signed)
Please stop smoking 6 weeks prior to having surgery.  I will make the following appointments and call you later today with the appointment information  Pulmonary Function Test, PET scan, and a CT guided Lung Biopsy  In 2 weeks and a follow up with Dr.Oaks to discuss the test results.   You will need to see Dr.Kowalski as well as we will need to obtain Cardiac Clearance.   Please call our office if you have questions or concerns.  Steps to Quit Smoking Smoking tobacco can be bad for your health. It can also affect almost every organ in your body. Smoking puts you and people around you at risk for many serious long-lasting (chronic) diseases. Quitting smoking is hard, but it is one of the best things that you can do for your health. It is never too late to quit. What are the benefits of quitting smoking? When you quit smoking, you lower your risk for getting serious diseases and conditions. They can include:  Lung cancer or lung disease.  Heart disease.  Stroke.  Heart attack.  Not being able to have children (infertility).  Weak bones (osteoporosis) and broken bones (fractures).  If you have coughing, wheezing, and shortness of breath, those symptoms may get better when you quit. You may also get sick less often. If you are pregnant, quitting smoking can help to lower your chances of having a baby of low birth weight. What can I do to help me quit smoking? Talk with your doctor about what can help you quit smoking. Some things you can do (strategies) include:  Quitting smoking totally, instead of slowly cutting back how much you smoke over a period of time.  Going to in-person counseling. You are more likely to quit if you go to many counseling sessions.  Using resources and support systems, such as: ? Database administrator with a Social worker. ? Phone quitlines. ? Careers information officer. ? Support groups or group counseling. ? Text messaging programs. ? Mobile phone apps or  applications.  Taking medicines. Some of these medicines may have nicotine in them. If you are pregnant or breastfeeding, do not take any medicines to quit smoking unless your doctor says it is okay. Talk with your doctor about counseling or other things that can help you.  Talk with your doctor about using more than one strategy at the same time, such as taking medicines while you are also going to in-person counseling. This can help make quitting easier. What things can I do to make it easier to quit? Quitting smoking might feel very hard at first, but there is a lot that you can do to make it easier. Take these steps:  Talk to your family and friends. Ask them to support and encourage you.  Call phone quitlines, reach out to support groups, or work with a Social worker.  Ask people who smoke to not smoke around you.  Avoid places that make you want (trigger) to smoke, such as: ? Bars. ? Parties. ? Smoke-break areas at work.  Spend time with people who do not smoke.  Lower the stress in your life. Stress can make you want to smoke. Try these things to help your stress: ? Getting regular exercise. ? Deep-breathing exercises. ? Yoga. ? Meditating. ? Doing a body scan. To do this, close your eyes, focus on one area of your body at a time from head to toe, and notice which parts of your body are tense. Try to relax the muscles in  those areas.  Download or buy apps on your mobile phone or tablet that can help you stick to your quit plan. There are many free apps, such as QuitGuide from the [employer] Office manager for Disease Control and Prevention). You can find more support from smokefree.gov and other websites.  This information is not intended to replace advice given to you by your health care provider. Make sure you discuss any questions you have with your health care provider. Document Released: 06/15/2009 Document Revised: 04/16/2016 Document Reviewed: 01/03/2015 Elsevier Interactive Patient  Education  2018 [employer].

## 2017-08-01 NOTE — Progress Notes (Signed)
Patient ID: Jeff Millard., male   DOB: Dec 05, 1950, 66 y.o.   MRN: [id number]  Chief Complaint  Patient presents with  . New Patient (Initial Visit)    Lung Nodules Left Lower Lobe Referred by [place]    Referred By Dr. Iris Pert Monday Reason for Referral left lower lobe mass  HPI Location, Quality, Duration, Severity, Timing, Context, Modifying Factors, Associated Signs and Symptoms.  Jeff Divis. is a 66 y.o. male.  This is a 66 year old gentleman with a long-standing smoking history.  He has smoked approximately 35 years over the last 50 years at least 1 pack cigarettes per day.  He also has stopped for periods of time and most recently has been smoking for the last 3 years.  He has had CT scans performed over the years and recently had a CT screening CT of the chest.  This revealed an increase in size of the left lower lobe groundglass opacity with solid components that was thought to be most consistent with an adenocarcinoma of the lung.  The patient also sees Dr. Nehemiah Massed for his hypertension.  He is been following up with him over the years.  He has a history of approximately 1-2 alcoholic drinks per day.  There is no family history of lung cancer.  He does have a history of melanoma in the family.   Past Medical History:  Diagnosis Date  . Cellulitis and abscess of trunk   . Coronary artery disease   . Gout 2011  . Hernia   . Malignant neoplasm of connective and other soft tissue of thorax   . Neoplasm of uncertain behavior of skin   . Other benign neoplasm of connective and other soft tissue of thorax   . Personal history of tobacco use, presenting hazards to health   . Prostate cancer ([place]) 2011  . Rib fracture   . Special screening for malignant neoplasms, colon   . Unspecified essential hypertension     Past Surgical History:  Procedure Laterality Date  . 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  The patient suffered divison of the long thoracic nreve during resection, resulting in a winged scapula.  . COLONOSCOPY  2007 and 2012   2007 done in [place]; 2012 done by Dr. Vira Agar  . COLONOSCOPY WITH PROPOFOL N/A 11/22/2016   Procedure: COLONOSCOPY WITH PROPOFOL;  Surgeon: Manya Silvas, MD;  Location: [facility] ENDOSCOPY;  Service: Endoscopy;  Laterality: N/A;  . INGUINAL HERNIA REPAIR Left 2010  . INGUINAL HERNIA REPAIR Right 2014  . LIPOMA EXCISION  2007 and 2012   right axilla/chest  . POLYPECTOMY  2012  . PROSTATECTOMY  April 2011  . THORACOTOMY  2012    Family History  Problem Relation Age of Onset  . COPD Mother   . Hypertension Mother   . Osteoporosis Mother   . Skin cancer Father   . Congestive Heart Failure Sister   . Osteoporosis Brother   . COPD Brother     Social History Social History   Tobacco Use  . Smoking status: Current Every Day Smoker    Packs/day: 0.75    Years: 40.00    Pack years: 30.00    Start date: 04/18/2015  . Smokeless tobacco: Former Network engineer Use Topics  . Alcohol use: Yes    Alcohol/week: 2.4 - 4.2 oz    Types: 1 - 2 Glasses of wine, 2 - 3 Shots of liquor, 1 -  2 Standard drinks or equivalent per week    Comment: liquor 2-3x weekly wine 1-2 glasses a day  . Drug use: No    No Known Allergies  Current Outpatient Medications  Medication Sig Dispense Refill  . alendronate (FOSAMAX) 70 MG tablet Take by mouth.    Marland Kitchen allopurinol (ZYLOPRIM) 100 MG tablet Take by mouth.    Marland Kitchen aspirin 81 MG tablet Take 81 mg by mouth daily.     . cyclobenzaprine (FLEXERIL) 10 MG tablet Take 1 tablet (10 mg total) by mouth 3 (three) times daily as needed for muscle spasms. 30 tablet 0  . sildenafil (VIAGRA) 100 MG tablet Take 0.5-1 tablets (50-100 mg total) by mouth daily as needed for erectile dysfunction. 36 tablet 3   No current facility-administered medications for this visit.       Review of Systems A complete review of systems was asked and  was negative except for the following positive findings occasional shortness of breath.  A history of weight loss several years ago secondary to stress which is now abated.  Blood pressure 120/76, pulse 73, height 5\' 9"  (1.753 m), weight 161 lb (73 kg).  Physical Exam CONSTITUTIONAL:  Pleasant, well-developed, well-nourished, and in no acute distress. EYES: Pupils equal and reactive to light, Sclera non-icteric EARS, NOSE, MOUTH AND THROAT:  The oropharynx was clear.  Dentition is good repair.  Oral mucosa pink and moist. LYMPH NODES:  Lymph nodes in the neck and axillae were normal RESPIRATORY:  Lungs were clear.  Normal respiratory effort without pathologic use of accessory muscles of respiration CARDIOVASCULAR: Heart was regular without murmurs.  There were no carotid bruits. GI: The abdomen was soft, nontender, and nondistended. There were no palpable masses. There was no hepatosplenomegaly. There were normal bowel sounds in all quadrants. GU:  Rectal deferred.   MUSCULOSKELETAL:  Normal muscle strength and tone.  No clubbing or cyanosis.   SKIN:  There were no pathologic skin lesions.  There were no nodules on palpation. NEUROLOGIC:  Sensation is normal.  Cranial nerves are grossly intact. PSYCH:  Oriented to person, place and time.  Mood and affect are normal.  Data Reviewed CT scans.  I have personally reviewed the patient's imaging, laboratory findings and medical records.    Assessment    I have independently reviewed the patient's CT scan and compared with the prior CTs.  There is a very slow-growing left lower lobe groundglass opacity with some solid components that is consistent with an adenocarcinoma.  I had a long discussion with he and his wife regarding the possible workup and etiologies as well as the possible interventions that could be entertained for this particular lesion.  We discussed the role pulmonary function testing as well as PET scanning and biopsy.  We also  discussed the role of surgery.    Plan    After an extensive discussion with the patient and his wife they would like to proceed with a set of pulmonary function studies and the PET scan.  We will see him back once those are complete.       Nestor Lewandowsky, MD 08/01/2017, 12:21 PM

## 2017-08-01 NOTE — Telephone Encounter (Signed)
Spoke with Pamala Hurry in [place] and advised her that I need a CT Guided Lung Biopsy scheduled before 12/14 if possible. She stated that she will contact me back as soon as she has an answer for me regarding this appointment. She did state that there are four other CT Lung Biopsy needing to be scheduled prior to this one. I verbalized understanding. I also told Pamala Hurry that I need a PFT test for the patient next week if possible. She was able to get me an appointment for the patient on 12/6 at 2:30 at the [facility]. She advised me to tell the patient to not cancel this appointment due to it being hard to get these test scheduled. I verbalized understanding.  Spoke with [place] at this time and advised her that I needed a PET Scan for the patient next week. She was able to schedule a PET Scan for 12/6 at 8:30 with 8:00 arrival at the [facility]. She verbalized NPO after midnight. I verbalized understanding and will advise patient.   Call made to patient at this time and I left a message on both home and mobile number at this time. Advising patient of the following: PFT: 12/6 at 2:30 arrive 2:[address] [facility]  PET Scan:8:30 12/6 arrive [place] NPO after midnight CT Guided Lung Biopsy: TBD.I advised patient to refrain from using any breathing treatments or medications prior to his PFT test. Advised patient that we will follow up with him on 12/10 at 8:00AM. Please advised patient of this information if call is returned.

## 2017-08-05 DIAGNOSIS — I447 Left bundle-branch block, unspecified: Secondary | ICD-10-CM | POA: Diagnosis not present

## 2017-08-05 DIAGNOSIS — E782 Mixed hyperlipidemia: Secondary | ICD-10-CM | POA: Diagnosis not present

## 2017-08-05 DIAGNOSIS — I2510 Atherosclerotic heart disease of native coronary artery without angina pectoris: Secondary | ICD-10-CM | POA: Diagnosis not present

## 2017-08-05 DIAGNOSIS — R0602 Shortness of breath: Secondary | ICD-10-CM | POA: Diagnosis not present

## 2017-08-05 DIAGNOSIS — Z01818 Encounter for other preprocedural examination: Secondary | ICD-10-CM | POA: Diagnosis not present

## 2017-08-05 DIAGNOSIS — I519 Heart disease, unspecified: Secondary | ICD-10-CM | POA: Diagnosis not present

## 2017-08-07 ENCOUNTER — Ambulatory Visit
Admission: RE | Admit: 2017-08-07 | Discharge: 2017-08-07 | Disposition: A | Discharge status: Home / Self Care | Payer: Medicare Other | Source: Ambulatory Visit | Attending: Cardiothoracic Surgery | Admitting: Cardiothoracic Surgery

## 2017-08-07 ENCOUNTER — Ambulatory Visit: Payer: Medicare Other

## 2017-08-07 DIAGNOSIS — I2510 Atherosclerotic heart disease of native coronary artery without angina pectoris: Secondary | ICD-10-CM | POA: Insufficient documentation

## 2017-08-07 DIAGNOSIS — R918 Other nonspecific abnormal finding of lung field: Secondary | ICD-10-CM | POA: Insufficient documentation

## 2017-08-07 DIAGNOSIS — I700 Atherosclerosis of aorta: Secondary | ICD-10-CM | POA: Diagnosis not present

## 2017-08-07 DIAGNOSIS — R911 Solitary pulmonary nodule: Secondary | ICD-10-CM | POA: Diagnosis not present

## 2017-08-07 LAB — GLUCOSE, CAPILLARY: GLUCOSE-CAPILLARY: 104 mg/dL — AB (ref 65–99)

## 2017-08-07 MED ORDER — FLUDEOXYGLUCOSE F - 18 (FDG) INJECTION
12.0000 | Freq: Once | INTRAVENOUS | Status: AC | PRN
  Administered 2017-08-07: 12.6 via INTRAVENOUS

## 2017-08-08 ENCOUNTER — Telehealth: Payer: Self-pay

## 2017-08-08 NOTE — Telephone Encounter (Signed)
Call made to [facility] and I left a message advising her that the patient's PFT testing was cancelled due to machine being down. I advised her that I had no idea about this being cancelled and that I was expecting him to have this completed due to falling up with [place] in office on Monday to review these results. I stated that we plan to follow up with patient this week and would like to know if we can get him rescheduled as soon as possible

## 2017-08-11 ENCOUNTER — Ambulatory Visit: Payer: Self-pay | Admitting: Cardiothoracic Surgery

## 2017-08-12 NOTE — Telephone Encounter (Signed)
Jeff Rhodes has called from scheduling and patient is having his PFT's done on 08/16/17 at 9:30am. They are opening a clinic to have these done on Saturday. I have spoke with Dr Genevive Bi and he would like to see the patient on 08/20/17 @ 8:30am due to his seeing other patient's that morning. Patient is also scheduled for his echo cardiogram on 08/14/17. Patient has been advised of his follow up appointment with Dr Genevive Bi.

## 2017-08-12 NOTE — Telephone Encounter (Signed)
Pamala Hurry from scheduling has called and stated that they are opening a Saturday clinic for PFT's. She is calling the patient to see if he can have this done Saturday. Once this is scheduled, I will add the patient to Dr Soyla Dryer schedule for follow up.

## 2017-08-13 NOTE — Telephone Encounter (Signed)
Call made to [facility] at this time and asked her if she had an update on patient CT Guided Biopsy since he has had his PET Scan completed. She stated that she would have to look into patient's status and give me a call back with this information. I verbalized understanding at this time.

## 2017-08-14 DIAGNOSIS — R001 Bradycardia, unspecified: Secondary | ICD-10-CM | POA: Insufficient documentation

## 2017-08-14 DIAGNOSIS — E782 Mixed hyperlipidemia: Secondary | ICD-10-CM | POA: Diagnosis not present

## 2017-08-14 DIAGNOSIS — I2510 Atherosclerotic heart disease of native coronary artery without angina pectoris: Secondary | ICD-10-CM | POA: Diagnosis not present

## 2017-08-14 DIAGNOSIS — R0602 Shortness of breath: Secondary | ICD-10-CM | POA: Diagnosis not present

## 2017-08-14 DIAGNOSIS — I519 Heart disease, unspecified: Secondary | ICD-10-CM | POA: Diagnosis not present

## 2017-08-15 DIAGNOSIS — H353212 Exudative age-related macular degeneration, right eye, with inactive choroidal neovascularization: Secondary | ICD-10-CM | POA: Diagnosis not present

## 2017-08-16 ENCOUNTER — Ambulatory Visit: Payer: Medicare Other | Attending: Cardiothoracic Surgery

## 2017-08-16 DIAGNOSIS — R918 Other nonspecific abnormal finding of lung field: Secondary | ICD-10-CM | POA: Insufficient documentation

## 2017-08-16 LAB — BLOOD GAS, ARTERIAL
Acid-Base Excess: 2.4 mmol/L — ABNORMAL HIGH (ref 0.0–2.0)
BICARBONATE: 27.2 mmol/L (ref 20.0–28.0)
FIO2: 0.21
O2 Saturation: 95.5 %
PCO2 ART: 42 mmHg (ref 32.0–48.0)
PH ART: 7.42 (ref 7.350–7.450)
PO2 ART: 77 mmHg — AB (ref 83.0–108.0)
Patient temperature: 37

## 2017-08-16 MED ORDER — ALBUTEROL SULFATE (2.5 MG/3ML) 0.083% IN NEBU
2.5000 mg | INHALATION_SOLUTION | Freq: Once | RESPIRATORY_TRACT | Status: AC
  Administered 2017-08-16: 2.5 mg via RESPIRATORY_TRACT
  Filled 2017-08-16: qty 3

## 2017-08-18 ENCOUNTER — Telehealth: Payer: Self-pay

## 2017-08-18 NOTE — Telephone Encounter (Signed)
Call made to [facility] at this time and I left a message wanting to know about the CT Guided Biopsy for patient.   Pamala Hurry from speciality scheduling returned my call at this time and verbalized that a Dr. August Saucer cancelled patient's CT Guided Biopsy. Dr. Genevive Bi has been made aware of this cancellation and asked to see patient tomorrow in office.    Call made to patient at this time. I spoke with patient's wife and informed her that Dr. Genevive Bi would like to see her husband in office tomorrow at [facility]. She verbalized understanding.

## 2017-08-19 ENCOUNTER — Ambulatory Visit (INDEPENDENT_AMBULATORY_CARE_PROVIDER_SITE_OTHER): Payer: Medicare Other | Admitting: Cardiothoracic Surgery

## 2017-08-19 ENCOUNTER — Encounter: Payer: Self-pay | Admitting: Cardiothoracic Surgery

## 2017-08-19 ENCOUNTER — Telehealth: Payer: Self-pay

## 2017-08-19 VITALS — BP 154/76 | HR 65 | Temp 97.7°F | Resp 18 | Ht 69.0 in | Wt 162.6 lb

## 2017-08-19 DIAGNOSIS — R918 Other nonspecific abnormal finding of lung field: Secondary | ICD-10-CM | POA: Diagnosis not present

## 2017-08-19 DIAGNOSIS — R911 Solitary pulmonary nodule: Secondary | ICD-10-CM | POA: Diagnosis not present

## 2017-08-19 NOTE — Patient Instructions (Signed)
I will call you later today with an appointment for your 1 year follow up CT scan and office visit with Dr.Oaks.

## 2017-08-19 NOTE — Telephone Encounter (Signed)
Patient notified of CT scan 08/19/18 @ 9:45 am. I let patient know I would schedule a follow up appointment with Dr.Oaks when template is available.

## 2017-08-19 NOTE — Progress Notes (Signed)
  Patient ID: Jeff Rhodes., male   DOB: 1950-09-07, 66 y.o.   MRN: [id number]  HISTORY: He returns today in follow-up.  He did have a PET scan done.  I discussed his results with Dr. Kathlene Cote in interventional radiology.  The PET scan does not suggest a malignant process.  The reading suggests that this lesion has been stable over 5 years and recommended a one-year follow-up.  The patient has no complaints today.   Vitals:   08/19/17 0810  BP: (!) 154/76  Pulse: 65  Resp: 18  Temp: 97.7 F (36.5 C)  SpO2: 96%     EXAM:    Resp: Lungs are clear bilaterally.  No respiratory distress, normal effort. Heart:  Regular without murmurs Abd:  Abdomen is soft, non distended and non tender. No masses are palpable.  There is no rebound and no guarding.  Neurological: Alert and oriented to person, place, and time. Coordination normal.  Skin: Skin is warm and dry. No rash noted. No diaphoretic. No erythema. No pallor.  Psychiatric: Normal mood and affect. Normal behavior. Judgment and thought content normal.    ASSESSMENT: I have independently reviewed the patient's PET scan.  I did compared to his prior scans.  The lesion in the left lower lobe has evolved somewhat but in general is essentially unchanged.  His pulmonary function studies are essentially unremarkable.   PLAN:   I had a long discussion with him today regarding the options.  We discussed the role of surgery to remove the left lower lobe or a percutaneous biopsy.  I explained to him that our interventional radiologist felt that the risk of biopsy outweighed the benefits.  Therefore the patient would like to come back in 1 year to have a repeat CT scan.  He did not wish to pursue any surgical intervention at this time without a diagnosis of malignancy.    Nestor Lewandowsky, MD

## 2017-08-20 ENCOUNTER — Ambulatory Visit: Payer: Medicare Other | Admitting: Cardiothoracic Surgery

## 2017-09-30 DIAGNOSIS — H35342 Macular cyst, hole, or pseudohole, left eye: Secondary | ICD-10-CM | POA: Diagnosis not present

## 2017-10-18 NOTE — Progress Notes (Signed)
Closing out old abstraction note

## 2017-10-29 ENCOUNTER — Encounter: Payer: Self-pay | Admitting: Family Medicine

## 2017-10-29 DIAGNOSIS — E669 Obesity, unspecified: Secondary | ICD-10-CM | POA: Diagnosis not present

## 2017-10-29 DIAGNOSIS — F17210 Nicotine dependence, cigarettes, uncomplicated: Secondary | ICD-10-CM | POA: Diagnosis not present

## 2017-10-29 DIAGNOSIS — I447 Left bundle-branch block, unspecified: Secondary | ICD-10-CM | POA: Diagnosis not present

## 2017-10-29 DIAGNOSIS — H35342 Macular cyst, hole, or pseudohole, left eye: Secondary | ICD-10-CM

## 2017-10-29 HISTORY — PX: PARS PLANA VITRECTOMY W/ REPAIR OF MACULAR HOLE: SHX2170

## 2017-10-29 HISTORY — DX: Macular cyst, hole, or pseudohole, left eye: H35.342

## 2017-12-08 DIAGNOSIS — H35342 Macular cyst, hole, or pseudohole, left eye: Secondary | ICD-10-CM | POA: Diagnosis not present

## 2018-02-04 DIAGNOSIS — I519 Heart disease, unspecified: Secondary | ICD-10-CM | POA: Diagnosis not present

## 2018-02-04 DIAGNOSIS — E782 Mixed hyperlipidemia: Secondary | ICD-10-CM | POA: Diagnosis not present

## 2018-02-04 DIAGNOSIS — I2510 Atherosclerotic heart disease of native coronary artery without angina pectoris: Secondary | ICD-10-CM | POA: Diagnosis not present

## 2018-04-06 DIAGNOSIS — H35342 Macular cyst, hole, or pseudohole, left eye: Secondary | ICD-10-CM | POA: Diagnosis not present

## 2018-06-22 ENCOUNTER — Ambulatory Visit: Payer: Medicare Other | Admitting: Family Medicine

## 2018-06-25 DIAGNOSIS — H2512 Age-related nuclear cataract, left eye: Secondary | ICD-10-CM | POA: Diagnosis not present

## 2018-08-04 DIAGNOSIS — H2512 Age-related nuclear cataract, left eye: Secondary | ICD-10-CM | POA: Diagnosis not present

## 2018-08-06 ENCOUNTER — Encounter: Payer: Self-pay | Admitting: *Deleted

## 2018-08-18 ENCOUNTER — Encounter
Admission: RE | Disposition: A | Discharge status: Home / Self Care | Payer: Self-pay | Source: Home / Self Care | Attending: Ophthalmology

## 2018-08-18 ENCOUNTER — Ambulatory Visit
Admission: RE | Admit: 2018-08-18 | Discharge: 2018-08-18 | Disposition: A | Discharge status: Home / Self Care | Payer: Medicare Other | Attending: Ophthalmology | Admitting: Ophthalmology

## 2018-08-18 ENCOUNTER — Ambulatory Visit: Payer: Medicare Other | Admitting: Anesthesiology

## 2018-08-18 ENCOUNTER — Other Ambulatory Visit: Payer: Self-pay

## 2018-08-18 ENCOUNTER — Encounter: Payer: Self-pay | Admitting: Emergency Medicine

## 2018-08-18 DIAGNOSIS — Z79899 Other long term (current) drug therapy: Secondary | ICD-10-CM | POA: Insufficient documentation

## 2018-08-18 DIAGNOSIS — F17210 Nicotine dependence, cigarettes, uncomplicated: Secondary | ICD-10-CM | POA: Insufficient documentation

## 2018-08-18 DIAGNOSIS — M109 Gout, unspecified: Secondary | ICD-10-CM | POA: Diagnosis not present

## 2018-08-18 DIAGNOSIS — Z7982 Long term (current) use of aspirin: Secondary | ICD-10-CM | POA: Insufficient documentation

## 2018-08-18 DIAGNOSIS — Z8546 Personal history of malignant neoplasm of prostate: Secondary | ICD-10-CM | POA: Diagnosis not present

## 2018-08-18 DIAGNOSIS — I10 Essential (primary) hypertension: Secondary | ICD-10-CM | POA: Diagnosis not present

## 2018-08-18 DIAGNOSIS — I739 Peripheral vascular disease, unspecified: Secondary | ICD-10-CM | POA: Diagnosis not present

## 2018-08-18 DIAGNOSIS — H2512 Age-related nuclear cataract, left eye: Secondary | ICD-10-CM | POA: Diagnosis not present

## 2018-08-18 DIAGNOSIS — E7800 Pure hypercholesterolemia, unspecified: Secondary | ICD-10-CM | POA: Insufficient documentation

## 2018-08-18 DIAGNOSIS — E782 Mixed hyperlipidemia: Secondary | ICD-10-CM | POA: Diagnosis not present

## 2018-08-18 HISTORY — DX: Unspecified hearing loss, unspecified ear: H91.90

## 2018-08-18 HISTORY — PX: CATARACT EXTRACTION W/PHACO: SHX586

## 2018-08-18 HISTORY — DX: Cardiac arrhythmia, unspecified: I49.9

## 2018-08-18 SURGERY — PHACOEMULSIFICATION, CATARACT, WITH IOL INSERTION
Anesthesia: Monitor Anesthesia Care | Site: Eye | Laterality: Left

## 2018-08-18 MED ORDER — SODIUM CHLORIDE 0.9 % IV SOLN
INTRAVENOUS | Status: DC
  Administered 2018-08-18 (×2): via INTRAVENOUS

## 2018-08-18 MED ORDER — TETRACAINE HCL 0.5 % OP SOLN
OPHTHALMIC | Status: AC
  Administered 2018-08-18: 1 [drp] via OPHTHALMIC
  Filled 2018-08-18: qty 4

## 2018-08-18 MED ORDER — FENTANYL CITRATE (PF) 100 MCG/2ML IJ SOLN
INTRAMUSCULAR | Status: AC
  Filled 2018-08-18: qty 2

## 2018-08-18 MED ORDER — POVIDONE-IODINE 5 % OP SOLN
OPHTHALMIC | Status: AC
  Filled 2018-08-18: qty 30

## 2018-08-18 MED ORDER — MOXIFLOXACIN HCL 0.5 % OP SOLN
OPHTHALMIC | Status: DC | PRN
  Administered 2018-08-18: .2 mL via OPHTHALMIC

## 2018-08-18 MED ORDER — NA CHONDROIT SULF-NA HYALURON 40-17 MG/ML IO SOLN
INTRAOCULAR | Status: AC
  Filled 2018-08-18: qty 1

## 2018-08-18 MED ORDER — EPINEPHRINE PF 1 MG/ML IJ SOLN
INTRAOCULAR | Status: DC | PRN
  Administered 2018-08-18: 1 mL via OPHTHALMIC

## 2018-08-18 MED ORDER — POVIDONE-IODINE 5 % OP SOLN
OPHTHALMIC | Status: DC | PRN
  Administered 2018-08-18: 1 via OPHTHALMIC

## 2018-08-18 MED ORDER — MIDAZOLAM HCL 2 MG/2ML IJ SOLN
INTRAMUSCULAR | Status: AC
  Filled 2018-08-18: qty 2

## 2018-08-18 MED ORDER — MIDAZOLAM HCL 2 MG/2ML IJ SOLN
INTRAMUSCULAR | Status: DC | PRN
  Administered 2018-08-18: 1 mg via INTRAVENOUS

## 2018-08-18 MED ORDER — CARBACHOL 0.01 % IO SOLN
INTRAOCULAR | Status: DC | PRN
  Administered 2018-08-18: .5 mL via INTRAOCULAR

## 2018-08-18 MED ORDER — FENTANYL CITRATE (PF) 100 MCG/2ML IJ SOLN
INTRAMUSCULAR | Status: DC | PRN
  Administered 2018-08-18: 50 ug via INTRAVENOUS

## 2018-08-18 MED ORDER — LIDOCAINE HCL (PF) 4 % IJ SOLN
INTRAOCULAR | Status: DC | PRN
  Administered 2018-08-18: 2 mL via OPHTHALMIC

## 2018-08-18 MED ORDER — EPINEPHRINE PF 1 MG/ML IJ SOLN
INTRAMUSCULAR | Status: AC
  Filled 2018-08-18: qty 1

## 2018-08-18 MED ORDER — ARMC OPHTHALMIC DILATING DROPS
1.0000 "application " | OPHTHALMIC | Status: AC
  Administered 2018-08-18 (×3): 1 via OPHTHALMIC

## 2018-08-18 MED ORDER — MOXIFLOXACIN HCL 0.5 % OP SOLN: 1.0000 [drp] | OPHTHALMIC | Status: DC | PRN

## 2018-08-18 MED ORDER — MOXIFLOXACIN HCL 0.5 % OP SOLN
OPHTHALMIC | Status: AC
  Filled 2018-08-18: qty 3

## 2018-08-18 MED ORDER — NA CHONDROIT SULF-NA HYALURON 40-17 MG/ML IO SOLN
INTRAOCULAR | Status: DC | PRN
  Administered 2018-08-18: 1 mL via INTRAOCULAR

## 2018-08-18 MED ORDER — LIDOCAINE HCL (PF) 4 % IJ SOLN
INTRAMUSCULAR | Status: AC
  Filled 2018-08-18: qty 5

## 2018-08-18 MED ORDER — ARMC OPHTHALMIC DILATING DROPS
OPHTHALMIC | Status: AC
  Administered 2018-08-18: 1 via OPHTHALMIC
  Filled 2018-08-18: qty 0.5

## 2018-08-18 MED ORDER — TETRACAINE HCL 0.5 % OP SOLN
1.0000 [drp] | OPHTHALMIC | Status: AC | PRN
  Administered 2018-08-18 (×3): 1 [drp] via OPHTHALMIC

## 2018-08-18 SURGICAL SUPPLY — 18 items
GLOVE BIO SURGEON STRL SZ8 (GLOVE) ×3 IMPLANT
GLOVE BIOGEL M 6.5 STRL (GLOVE) ×3 IMPLANT
GLOVE SURG LX 8.0 MICRO (GLOVE) ×2
GLOVE SURG LX STRL 8.0 MICRO (GLOVE) ×1 IMPLANT
GOWN STRL REUS W/ TWL LRG LVL3 (GOWN DISPOSABLE) ×2 IMPLANT
GOWN STRL REUS W/TWL LRG LVL3 (GOWN DISPOSABLE) ×4
LABEL CATARACT MEDS ST (LABEL) ×3 IMPLANT
LENS IOL ACRSF IQ TRC 4 13.5 ×1 IMPLANT
LENS IOL ACRYSOF IQ TORIC 13.5 ×2 IMPLANT
LENS IOL IQ TORIC 4 13.5 ×1 IMPLANT
PACK CATARACT (MISCELLANEOUS) ×3 IMPLANT
PACK CATARACT BRASINGTON LX (MISCELLANEOUS) ×3 IMPLANT
PACK EYE AFTER SURG (MISCELLANEOUS) ×3 IMPLANT
SOL BSS BAG (MISCELLANEOUS) ×3
SOLUTION BSS BAG (MISCELLANEOUS) ×1 IMPLANT
SYR 5ML LL (SYRINGE) ×3 IMPLANT
WATER STERILE IRR 250ML POUR (IV SOLUTION) ×3 IMPLANT
WIPE NON LINTING 3.25X3.25 (MISCELLANEOUS) ×3 IMPLANT

## 2018-08-18 NOTE — Anesthesia Post-op Follow-up Note (Signed)
Anesthesia QCDR form completed.        

## 2018-08-18 NOTE — Discharge Instructions (Signed)
Eye Surgery Discharge Instructions    Expect mild scratchy sensation or mild soreness. DO NOT RUB YOUR EYE!  The day of surgery:  Minimal physical activity, but bed rest is not required  No reading, computer work, or close hand work  No bending, lifting, or straining.  May watch TV  For 24 hours:  No driving, legal decisions, or alcoholic beverages  Safety precautions  Eat anything you prefer: It is better to start with liquids, then soup then solid foods.  Solar shield eyeglasses should be worn for comfort in the sunlight/patch while sleeping  Resume all regular medications including aspirin or Coumadin if these were discontinued prior to surgery. You may shower, bathe, shave, or wash your hair. Tylenol may be taken for mild discomfort. PLEASE FOLLOW DR. PORFILIO'S EYE DROP INSTRUCTIONS.  Call your doctor if you experience significant pain, nausea, or vomiting, fever > 101 or other signs of infection. [phone] or [phone] Specific instructions:  Follow-up Information    Birder Robson, MD Follow up on 08/19/2018.   Specialty:  Ophthalmology Why:  at [facility] information: [address] [place] [address] [phone]

## 2018-08-18 NOTE — Anesthesia Postprocedure Evaluation (Addendum)
Anesthesia Post Note  Patient: Jeff Rhodes.  Procedure(s) Performed: CATARACT EXTRACTION PHACO AND INTRAOCULAR LENS PLACEMENT (IOC) LEFT (Left Eye)  Patient location during evaluation: Short Stay Anesthesia Type: MAC Level of consciousness: awake and awake and alert Pain management: pain level controlled Vital Signs Assessment: post-procedure vital signs reviewed and stable Respiratory status: spontaneous breathing Cardiovascular status: blood pressure returned to baseline and stable Postop Assessment: no headache Anesthetic complications: no     Last Vitals:  Vitals:   08/18/18 0943  BP: 111/71  Pulse: 75  Resp: 17  Temp: (!) 35.7 C  SpO2: 98%    Last Pain:  Vitals:   08/18/18 0943  TempSrc: Tympanic  PainSc: 0-No pain                 Buckner Malta

## 2018-08-18 NOTE — Op Note (Signed)
PREOPERATIVE DIAGNOSIS:  Nuclear sclerotic cataract of the left eye.   POSTOPERATIVE DIAGNOSIS:  Nuclear sclerotic cataract of the left eye.   OPERATIVE PROCEDURE: Procedure(s): CATARACT EXTRACTION PHACO AND INTRAOCULAR LENS PLACEMENT (IOC) LEFT   SURGEON:  Birder Robson, MD.   ANESTHESIA: 1.      Managed anesthesia care. 2.     0.64ml os Shugarcaine was instilled following the paracentesis 2oranesstaff@   COMPLICATIONS:  None.   TECHNIQUE:   Stop and chop    DESCRIPTION OF PROCEDURE:  The patient was examined and consented in the preoperative holding area where the aforementioned topical anesthesia was applied to the left eye.  The patient was brought back to the Operating Room where he was sat upright on the gurney and given a target to fixate upon while the eye was marked at the 3:00 and 9:00 position.  The patient was then reclined on the operating table.  The eye was prepped and draped in the usual sterile ophthalmic fashion and a lid speculum was placed. A paracentesis was created with the side port blade and the anterior chamber was filled with viscoelastic. A near clear corneal incision was performed with the steel keratome. A continuous curvilinear capsulorrhexis was performed with a cystotome followed by the capsulorrhexis forceps. Hydrodissection and hydrodelineation were carried out with BSS on a blunt cannula. The lens was removed in a stop and chop technique and the remaining cortical material was removed with the irrigation-aspiration handpiece. The eye was inflated with viscoelastic and the ZCT lens was placed in the eye and rotated to within a few degrees of the predetermined orientation.  The remaining viscoelastic was removed from the eye.  The Sinskey hook was used to rotate the toric lens into its final resting place at 174  degrees.  0.1 ml of Vigamox was placed in the anterior chamber. The eye was inflated to a physiologic pressure and found to be watertight.  The eye was  dressed with Vigamox. The patient was given protective glasses to wear throughout the day and a shield with which to sleep tonight. The patient was also given drops with which to begin a drop regimen today and will follow-up with me in one day. Implant Name Type Inv. Item Serial No. Manufacturer Lot No. LRB No. Used  LENS IOL TORIC 13.5 - [id number]  LENS IOL TORIC 13.5 [id number] ALCON [id number] Left 1   Procedure(s) with comments: CATARACT EXTRACTION PHACO AND INTRAOCULAR LENS PLACEMENT (IOC) LEFT (Left) - [place] 00:41 CDE 7.49 Fluid pack lot #  [id number]  Electronically signed: Birder Robson 12/17/201911:10 AM

## 2018-08-18 NOTE — Anesthesia Procedure Notes (Signed)
Procedure Name: MAC Date/Time: 08/18/2018 10:57 AM Performed by: Allean Found, CRNA Pre-anesthesia Checklist: Patient identified, Emergency Drugs available, Suction available, Patient being monitored and Timeout performed Oxygen Delivery Method: Nasal cannula Placement Confirmation: positive ETCO2

## 2018-08-18 NOTE — H&P (Signed)
All labs reviewed. Abnormal studies sent to patients PCP when indicated.  Previous H&P reviewed, patient examined, there are NO CHANGES.  Jeff Rhodes Porfilio12/17/201910:40 AM

## 2018-08-18 NOTE — Anesthesia Preprocedure Evaluation (Signed)
Anesthesia Evaluation  Patient identified by MRN, date of birth, ID band Patient awake    Reviewed: Allergy & Precautions, H&P , NPO status , Patient's Chart, lab work & pertinent test results, reviewed documented beta blocker date and time   History of Anesthesia Complications Negative for: history of anesthetic complications  Airway Mallampati: II   Neck ROM: full    Dental  (+) Poor Dentition, Dental Advidsory Given   Pulmonary neg shortness of breath, neg sleep apnea, neg COPD, neg recent URI, Current Smoker, former smoker,           Cardiovascular hypertension, (-) angina+ CAD and + Peripheral Vascular Disease  (-) Past MI, (-) Cardiac Stents and (-) CABG + dysrhythmias (-) Valvular Problems/Murmurs     Neuro/Psych negative neurological ROS  negative psych ROS   GI/Hepatic negative GI ROS, Neg liver ROS,   Endo/Other  negative endocrine ROS  Renal/GU negative Renal ROS  negative genitourinary   Musculoskeletal   Abdominal   Peds  Hematology negative hematology ROS (+)   Anesthesia Other Findings Past Medical History: No date: Cellulitis and abscess of trunk No date: Coronary artery disease 2011: Gout No date: Hernia No date: Malignant neoplasm of connective and other sof* No date: Neoplasm of uncertain behavior of skin No date: Other benign neoplasm of connective and other * No date: Personal history of tobacco use, presenting ha* 2011: Prostate cancer ([place]) No date: Special screening for malignant neoplasms, col* No date: Unspecified essential hypertension Past Surgical History: 2012: AXILLARY SURGERY Right     Comment: intrathoracic extension; 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 The patient suffered               divison of the long thoracic nreve during               resection, resulting in a winged scapula. 2007 and 2012: COLONOSCOPY     Comment: 2007 done in [place]; 2012  done by Dr.               Vira Agar 2010: Trail Left 2014: [place] Right 2007 and 2012: LIPOMA EXCISION     Comment: right axilla/chest 2012: POLYPECTOMY April 2011: PROSTATECTOMY 2012: THORACOTOMY BMI    Body Mass Index:  25.10 kg/m     Reproductive/Obstetrics negative OB ROS                             Anesthesia Physical  Anesthesia Plan  ASA: III  Anesthesia Plan: MAC   Post-op Pain Management:    Induction: Intravenous  PONV Risk Score and Plan: Midazolam  Airway Management Planned: Nasal Cannula  Additional Equipment:   Intra-op Plan:   Post-operative Plan:   Informed Consent: I have reviewed the patients History and Physical, chart, labs and discussed the procedure including the risks, benefits and alternatives for the proposed anesthesia with the patient or authorized representative who has indicated his/her understanding and acceptance.   Dental Advisory Given  Plan Discussed with: CRNA  Anesthesia Plan Comments:         Anesthesia Quick Evaluation

## 2018-08-18 NOTE — H&P (Signed)
All labs reviewed. Abnormal studies sent to patients PCP when indicated.  Previous H&P reviewed, patient examined, there are NO CHANGES.  Jeff Nippert Porfilio12/17/20191:45 PM

## 2018-08-18 NOTE — Transfer of Care (Addendum)
Immediate Anesthesia Transfer of Care Note  Patient: Jeff Rhodes.  Procedure(s) Performed: CATARACT EXTRACTION PHACO AND INTRAOCULAR LENS PLACEMENT (IOC) LEFT (Left Eye)  Patient Location: PACU  Anesthesia Type:MAC  Level of Consciousness: awake and alert   Airway & Oxygen Therapy: Patient Spontanous Breathing  Post-op Assessment: Report given to RN and Post -op Vital signs reviewed and stable  Post vital signs: Reviewed and stable  Last Vitals:  Vitals Value Taken Time  BP    Temp    Pulse    Resp    SpO2      Last Pain:  Vitals:   08/18/18 0943  TempSrc: Tympanic  PainSc: 0-No pain         Complications: No apparent anesthesia complications

## 2018-08-19 ENCOUNTER — Ambulatory Visit
Admission: RE | Admit: 2018-08-19 | Discharge: 2018-08-19 | Disposition: A | Discharge status: Home / Self Care | Payer: Medicare Other | Source: Ambulatory Visit | Attending: Cardiothoracic Surgery | Admitting: Cardiothoracic Surgery

## 2018-08-19 DIAGNOSIS — R918 Other nonspecific abnormal finding of lung field: Secondary | ICD-10-CM

## 2018-08-19 DIAGNOSIS — R911 Solitary pulmonary nodule: Secondary | ICD-10-CM | POA: Diagnosis not present

## 2018-08-24 ENCOUNTER — Encounter: Payer: Self-pay | Admitting: Cardiothoracic Surgery

## 2018-08-24 ENCOUNTER — Other Ambulatory Visit: Payer: Self-pay

## 2018-08-24 ENCOUNTER — Ambulatory Visit (INDEPENDENT_AMBULATORY_CARE_PROVIDER_SITE_OTHER): Payer: Medicare Other | Admitting: Cardiothoracic Surgery

## 2018-08-24 VITALS — BP 144/76 | HR 75 | Temp 99.0°F | Resp 16 | Ht 69.0 in | Wt 157.6 lb

## 2018-08-24 DIAGNOSIS — H2511 Age-related nuclear cataract, right eye: Secondary | ICD-10-CM | POA: Diagnosis not present

## 2018-08-24 DIAGNOSIS — R911 Solitary pulmonary nodule: Secondary | ICD-10-CM | POA: Diagnosis not present

## 2018-08-24 NOTE — Patient Instructions (Addendum)
We will place you in the recall box to have a CT chest and to see DR.Oaks after the scan.   Please call our office and let us know what you decide of the  options that were discussed today in office with Dr.Oaks.

## 2018-08-24 NOTE — Progress Notes (Signed)
  Patient ID: Jeff Rhodes., male   DOB: 03/30/1951, 67 y.o.   MRN: [id number]  HISTORY: Mr. Zerbe returns today in follow-up.  He did have his chest CT scan made last week.  He denies any new problems.  He did have his left cataract fixed last week and is scheduled to follow-up with his ophthalmologist this week as well.  He is scheduled to have his right cataract fixed in the future.  Unfortunately he does continue to smoke at least a half a pack cigarettes a day.  He does not complain of any shortness of breath.   Vitals:   08/24/18 0901  BP: (!) 144/76  Pulse: 75  Resp: 16  Temp: 99 F (37.2 C)  SpO2: 93%     EXAM:    Resp: Lungs are clear bilaterally.  No respiratory distress, normal effort. Heart:  Regular without murmurs Abd:  Abdomen is soft, non distended and non tender. No masses are palpable.  There is no rebound and no guarding.  Neurological: Alert and oriented to person, place, and time. Coordination normal.  Skin: Skin is warm and dry. No rash noted. No diaphoretic. No erythema. No pallor.  Psychiatric: Normal mood and affect. Normal behavior. Judgment and thought content normal.    ASSESSMENT: I have independently reviewed the patient's CT scan and I reviewed the radiology report.  The CT of the chest shows a continued presence of a 1.7 cm irregular groundglass opacity in the left lower lobe.  A slow-growing indolent adenocarcinoma of the lung is not been excluded.  This has enlarged slightly since 2013 but is unchanged over the last year.   PLAN:   I had a long discussion with him today regarding the options.  We could continue to follow this with annual surveillance.  However it has not gone away in the last couple years and we would not anticipate it to do so.  I also explained to him that if he were going to undergo surgical intervention for this that he would be a better surgical candidate today than in a couple years.  I reviewed with him the option of  undergoing biopsy either by bronchoscopy or transthoracic needle biopsy.  I also reviewed with him the option of surgery for lung resection.  Of course he is concerned that he does not carry a diagnosis of malignancy and is reluctant to discuss surgical intervention at this time without a specific diagnosis.  He also brought up the possibility of a second opinion at [facility] or [facility] and I encouraged him to pursue that if that would be something he wishes.  In the meantime we have gone ahead and set him up to have a CT scan of the chest made in 1 year so that we could have continued follow-up.  He will go home and discuss his options with his wife and he will contact me if he chooses to pursue any other options.    Nestor Lewandowsky, MD

## 2018-08-27 ENCOUNTER — Encounter: Payer: Self-pay | Admitting: *Deleted

## 2018-08-31 DIAGNOSIS — J019 Acute sinusitis, unspecified: Secondary | ICD-10-CM | POA: Diagnosis not present

## 2018-09-08 ENCOUNTER — Ambulatory Visit: Payer: Medicare Other | Admitting: Anesthesiology

## 2018-09-08 ENCOUNTER — Ambulatory Visit
Admission: RE | Admit: 2018-09-08 | Discharge: 2018-09-08 | Disposition: A | Discharge status: Home / Self Care | Payer: Medicare Other | Attending: Ophthalmology | Admitting: Ophthalmology

## 2018-09-08 ENCOUNTER — Other Ambulatory Visit: Payer: Self-pay

## 2018-09-08 ENCOUNTER — Encounter: Payer: Self-pay | Admitting: *Deleted

## 2018-09-08 ENCOUNTER — Encounter
Admission: RE | Disposition: A | Discharge status: Home / Self Care | Payer: Self-pay | Source: Home / Self Care | Attending: Ophthalmology

## 2018-09-08 DIAGNOSIS — Z79899 Other long term (current) drug therapy: Secondary | ICD-10-CM | POA: Insufficient documentation

## 2018-09-08 DIAGNOSIS — E782 Mixed hyperlipidemia: Secondary | ICD-10-CM | POA: Diagnosis not present

## 2018-09-08 DIAGNOSIS — Z9842 Cataract extraction status, left eye: Secondary | ICD-10-CM | POA: Insufficient documentation

## 2018-09-08 DIAGNOSIS — I447 Left bundle-branch block, unspecified: Secondary | ICD-10-CM | POA: Diagnosis not present

## 2018-09-08 DIAGNOSIS — H2511 Age-related nuclear cataract, right eye: Secondary | ICD-10-CM | POA: Insufficient documentation

## 2018-09-08 DIAGNOSIS — H9190 Unspecified hearing loss, unspecified ear: Secondary | ICD-10-CM | POA: Diagnosis not present

## 2018-09-08 DIAGNOSIS — I10 Essential (primary) hypertension: Secondary | ICD-10-CM | POA: Diagnosis not present

## 2018-09-08 DIAGNOSIS — Z7982 Long term (current) use of aspirin: Secondary | ICD-10-CM | POA: Diagnosis not present

## 2018-09-08 DIAGNOSIS — I2510 Atherosclerotic heart disease of native coronary artery without angina pectoris: Secondary | ICD-10-CM | POA: Insufficient documentation

## 2018-09-08 DIAGNOSIS — Z8546 Personal history of malignant neoplasm of prostate: Secondary | ICD-10-CM | POA: Diagnosis not present

## 2018-09-08 DIAGNOSIS — E119 Type 2 diabetes mellitus without complications: Secondary | ICD-10-CM | POA: Diagnosis not present

## 2018-09-08 DIAGNOSIS — E7800 Pure hypercholesterolemia, unspecified: Secondary | ICD-10-CM | POA: Insufficient documentation

## 2018-09-08 DIAGNOSIS — F17200 Nicotine dependence, unspecified, uncomplicated: Secondary | ICD-10-CM | POA: Insufficient documentation

## 2018-09-08 DIAGNOSIS — M109 Gout, unspecified: Secondary | ICD-10-CM | POA: Diagnosis not present

## 2018-09-08 HISTORY — PX: CATARACT EXTRACTION W/PHACO: SHX586

## 2018-09-08 SURGERY — PHACOEMULSIFICATION, CATARACT, WITH IOL INSERTION
Anesthesia: Monitor Anesthesia Care | Site: Eye | Laterality: Right

## 2018-09-08 MED ORDER — NA CHONDROIT SULF-NA HYALURON 40-17 MG/ML IO SOLN
INTRAOCULAR | Status: DC | PRN
  Administered 2018-09-08: 1 mL via INTRAOCULAR

## 2018-09-08 MED ORDER — ARMC OPHTHALMIC DILATING DROPS
1.0000 "application " | OPHTHALMIC | Status: AC
  Administered 2018-09-08 (×3): 1 via OPHTHALMIC

## 2018-09-08 MED ORDER — POVIDONE-IODINE 5 % OP SOLN
OPHTHALMIC | Status: AC
  Filled 2018-09-08: qty 30

## 2018-09-08 MED ORDER — EPINEPHRINE PF 1 MG/ML IJ SOLN
INTRAMUSCULAR | Status: AC
  Filled 2018-09-08: qty 2

## 2018-09-08 MED ORDER — SODIUM CHLORIDE 0.9 % IV SOLN
INTRAVENOUS | Status: DC
  Administered 2018-09-08: 12:00:00 via INTRAVENOUS

## 2018-09-08 MED ORDER — POVIDONE-IODINE 5 % OP SOLN
OPHTHALMIC | Status: DC | PRN
  Administered 2018-09-08: 1 via OPHTHALMIC

## 2018-09-08 MED ORDER — MOXIFLOXACIN HCL 0.5 % OP SOLN
OPHTHALMIC | Status: DC | PRN
  Administered 2018-09-08: 0.2 mL via OPHTHALMIC

## 2018-09-08 MED ORDER — LIDOCAINE HCL (PF) 4 % IJ SOLN
INTRAMUSCULAR | Status: AC
  Filled 2018-09-08: qty 5

## 2018-09-08 MED ORDER — CARBACHOL 0.01 % IO SOLN
INTRAOCULAR | Status: DC | PRN
  Administered 2018-09-08: 0.5 mL via INTRAOCULAR

## 2018-09-08 MED ORDER — TETRACAINE HCL 0.5 % OP SOLN
1.0000 [drp] | OPHTHALMIC | Status: AC | PRN
  Administered 2018-09-08 (×3): 1 [drp] via OPHTHALMIC

## 2018-09-08 MED ORDER — FENTANYL CITRATE (PF) 100 MCG/2ML IJ SOLN
INTRAMUSCULAR | Status: DC | PRN
  Administered 2018-09-08: 50 ug via INTRAVENOUS

## 2018-09-08 MED ORDER — TETRACAINE HCL 0.5 % OP SOLN
OPHTHALMIC | Status: AC
  Filled 2018-09-08: qty 4

## 2018-09-08 MED ORDER — EPINEPHRINE PF 1 MG/ML IJ SOLN
INTRAOCULAR | Status: DC | PRN
  Administered 2018-09-08: 12:00:00 via OPHTHALMIC

## 2018-09-08 MED ORDER — ARMC OPHTHALMIC DILATING DROPS
OPHTHALMIC | Status: AC
  Filled 2018-09-08: qty 0.5

## 2018-09-08 MED ORDER — NA CHONDROIT SULF-NA HYALURON 40-17 MG/ML IO SOLN
INTRAOCULAR | Status: AC
  Filled 2018-09-08: qty 1

## 2018-09-08 MED ORDER — MOXIFLOXACIN HCL 0.5 % OP SOLN: 1.0000 [drp] | OPHTHALMIC | Status: DC | PRN

## 2018-09-08 MED ORDER — FENTANYL CITRATE (PF) 100 MCG/2ML IJ SOLN
INTRAMUSCULAR | Status: AC
  Filled 2018-09-08: qty 2

## 2018-09-08 MED ORDER — MIDAZOLAM HCL 2 MG/2ML IJ SOLN
INTRAMUSCULAR | Status: AC
  Filled 2018-09-08: qty 2

## 2018-09-08 MED ORDER — MIDAZOLAM HCL 2 MG/2ML IJ SOLN
INTRAMUSCULAR | Status: DC | PRN
  Administered 2018-09-08: 1 mg via INTRAVENOUS

## 2018-09-08 MED ORDER — LIDOCAINE HCL (PF) 4 % IJ SOLN
INTRAOCULAR | Status: DC | PRN
  Administered 2018-09-08: 4 mL via OPHTHALMIC

## 2018-09-08 MED ORDER — MOXIFLOXACIN HCL 0.5 % OP SOLN
OPHTHALMIC | Status: AC
  Filled 2018-09-08: qty 3

## 2018-09-08 SURGICAL SUPPLY — 18 items
GLOVE BIO SURGEON STRL SZ8 (GLOVE) ×3 IMPLANT
GLOVE BIOGEL M 6.5 STRL (GLOVE) ×3 IMPLANT
GLOVE SURG LX 8.0 MICRO (GLOVE) ×2
GLOVE SURG LX STRL 8.0 MICRO (GLOVE) ×1 IMPLANT
GOWN STRL REUS W/ TWL LRG LVL3 (GOWN DISPOSABLE) ×2 IMPLANT
GOWN STRL REUS W/TWL LRG LVL3 (GOWN DISPOSABLE) ×4
LABEL CATARACT MEDS ST (LABEL) ×3 IMPLANT
LENS IOL ACRSF IQ TRC 4 14.5 IMPLANT
LENS IOL ACRYSOF IQ TORIC 14.5 ×2 IMPLANT
LENS IOL IQ TORIC 4 14.5 ×1 IMPLANT
PACK CATARACT (MISCELLANEOUS) ×3 IMPLANT
PACK CATARACT BRASINGTON LX (MISCELLANEOUS) ×3 IMPLANT
PACK EYE AFTER SURG (MISCELLANEOUS) ×3 IMPLANT
SOL BSS BAG (MISCELLANEOUS) ×3
SOLUTION BSS BAG (MISCELLANEOUS) ×1 IMPLANT
SYR 5ML LL (SYRINGE) ×3 IMPLANT
WATER STERILE IRR 250ML POUR (IV SOLUTION) ×3 IMPLANT
WIPE NON LINTING 3.25X3.25 (MISCELLANEOUS) ×3 IMPLANT

## 2018-09-08 NOTE — Anesthesia Post-op Follow-up Note (Signed)
Anesthesia QCDR form completed.        

## 2018-09-08 NOTE — Anesthesia Preprocedure Evaluation (Signed)
Anesthesia Evaluation  Patient identified by MRN, date of birth, ID band Patient awake    Reviewed: Allergy & Precautions, H&P , NPO status , Patient's Chart, lab work & pertinent test results, reviewed documented beta blocker date and time   Airway Mallampati: II  TM Distance: >3 FB Neck ROM: full    Dental no notable dental hx. (+) Teeth Intact   Pulmonary neg pulmonary ROS, Current Smoker,    Pulmonary exam normal breath sounds clear to auscultation       Cardiovascular Exercise Tolerance: Good hypertension, On Medications + CAD  + dysrhythmias  Rhythm:regular Rate:Normal     Neuro/Psych negative neurological ROS  negative psych ROS   GI/Hepatic negative GI ROS, Neg liver ROS,   Endo/Other  negative endocrine ROSdiabetes, Well Controlled, Type 2  Renal/GU      Musculoskeletal   Abdominal   Peds  Hematology negative hematology ROS (+)   Anesthesia Other Findings   Reproductive/Obstetrics negative OB ROS                             Anesthesia Physical Anesthesia Plan  ASA: III  Anesthesia Plan: MAC   Post-op Pain Management:    Induction:   PONV Risk Score and Plan:   Airway Management Planned:   Additional Equipment:   Intra-op Plan:   Post-operative Plan:   Informed Consent: I have reviewed the patients History and Physical, chart, labs and discussed the procedure including the risks, benefits and alternatives for the proposed anesthesia with the patient or authorized representative who has indicated his/her understanding and acceptance.     Plan Discussed with: CRNA  Anesthesia Plan Comments:         Anesthesia Quick Evaluation

## 2018-09-08 NOTE — Transfer of Care (Signed)
Immediate Anesthesia Transfer of Care Note  Patient: Jeff Rhodes.  Procedure(s) Performed: CATARACT EXTRACTION PHACO AND INTRAOCULAR LENS PLACEMENT (IOC) RIGHT TORIC (Right Eye)  Patient Location: PACU  Anesthesia Type:MAC  Level of Consciousness: awake, alert  and oriented  Airway & Oxygen Therapy: Patient Spontanous Breathing  Post-op Assessment: Report given to RN  Post vital signs: Reviewed and stable  Last Vitals:  Vitals Value Taken Time  BP    Temp    Pulse    Resp    SpO2      Last Pain:  Vitals:   09/08/18 1241  TempSrc:   PainSc: 0-No pain         Complications: No apparent anesthesia complications

## 2018-09-08 NOTE — Anesthesia Procedure Notes (Signed)
Procedure Name: MAC Date/Time: 09/08/2018 12:09 PM Performed by: Johnna Acosta, CRNA Pre-anesthesia Checklist: Patient identified, Emergency Drugs available, Suction available, Patient being monitored and Timeout performed Patient Re-evaluated:Patient Re-evaluated prior to induction Oxygen Delivery Method: Nasal cannula Preoxygenation: Pre-oxygenation with 100% oxygen

## 2018-09-08 NOTE — Op Note (Signed)
PREOPERATIVE DIAGNOSIS:  Nuclear sclerotic cataract of the right eye.   POSTOPERATIVE DIAGNOSIS:  Nuclear sclerotic cataract of the right eye.   OPERATIVE PROCEDURE: Procedure(s): CATARACT EXTRACTION PHACO AND INTRAOCULAR LENS PLACEMENT (IOC) RIGHT TORIC   SURGEON:  Birder Robson, MD.   ANESTHESIA: 1.      Managed anesthesia care. 2.     0.49ml of Shugarcaine was instilled following the paracentesis  Anesthesiologist: Molli Barrows, MD CRNA: Johnna Acosta, CRNA  COMPLICATIONS:  None.   TECHNIQUE:   Stop and chop    DESCRIPTION OF PROCEDURE:  The patient was examined and consented in the preoperative holding area where the aforementioned topical anesthesia was applied to the right eye.  The patient was brought back to the Operating Room where he was sat upright on the gurney and given a target to fixate upon while the eye was marked at the 3:00 and 9:00 position.  The patient was then reclined on the operating table.  The eye was prepped and draped in the usual sterile ophthalmic fashion and a lid speculum was placed. A paracentesis was created with the side port blade and the anterior chamber was filled with viscoelastic. A near clear corneal incision was performed with the steel keratome. A continuous curvilinear capsulorrhexis was performed with a cystotome followed by the capsulorrhexis forceps. Hydrodissection and hydrodelineation were carried out with BSS on a blunt cannula. The lens was removed in a stop and chop technique and the remaining cortical material was removed with the irrigation-aspiration handpiece. The eye was inflated with viscoelastic and the ZCT  lens  was placed in the eye and rotated to within a few degrees of the predetermined orientation.  The remaining viscoelastic was removed from the eye.  The Sinskey hook was used to rotate the toric lens into its final resting place at 002 degrees.  0. The eye was inflated to a physiologic pressure and found to be  watertight. 0.9ml of Vigamox was placed in the anterior chamber.  The eye was dressed with Vigamox. The patient was given protective glasses to wear throughout the day and a shield with which to sleep tonight. The patient was also given drops with which to begin a drop regimen today and will follow-up with me in one day. Implant Name Type Inv. Item Serial No. Manufacturer Lot No. LRB No. Used  LENS IOL TORIC 14.5 - [id number]  LENS IOL TORIC 14.5 [id number] [employer]CON  Right 1   Procedure(s) with comments: CATARACT EXTRACTION PHACO AND INTRAOCULAR LENS PLACEMENT (IOC) RIGHT TORIC (Right) - [place] 00:28.8 CDE 3.61 Fluid Pack Lot # [id number]  Electronically signed: Birder Robson 09/08/2018 12:29 PM

## 2018-09-08 NOTE — H&P (Signed)
All labs reviewed. Abnormal studies sent to patients PCP when indicated.  Previous H&P reviewed, patient examined, there are NO CHANGES.  Jeff Teschner Porfilio1/7/202012:02 PM

## 2018-09-08 NOTE — Discharge Instructions (Signed)
Eye Surgery Discharge Instructions    Expect mild scratchy sensation or mild soreness. DO NOT RUB YOUR EYE!  The day of surgery:  Minimal physical activity, but bed rest is not required  No reading, computer work, or close hand work  No bending, lifting, or straining.  May watch TV  For 24 hours:  No driving, legal decisions, or alcoholic beverages  Safety precautions  Eat anything you prefer: It is better to start with liquids, then soup then solid foods.  _____ Eye patch should be worn until postoperative exam tomorrow.  ____ Solar shield eyeglasses should be worn for comfort in the sunlight/patch while sleeping  Resume all regular medications including aspirin or Coumadin if these were discontinued prior to surgery. You may shower, bathe, shave, or wash your hair. Tylenol may be taken for mild discomfort.  Call your doctor if you experience significant pain, nausea, or vomiting, fever > 101 or other signs of infection. [phone] or [phone] Specific instructions:  Follow-up Information    Birder Robson, MD Follow up on 09/09/2018.   Specialty:  Ophthalmology Why:  8:30 Contact information: [address] [place] [address] [phone]

## 2018-09-09 ENCOUNTER — Encounter: Payer: Self-pay | Admitting: Ophthalmology

## 2018-09-10 NOTE — Anesthesia Postprocedure Evaluation (Signed)
Anesthesia Post Note  Patient: Jeff Rhodes.  Procedure(s) Performed: CATARACT EXTRACTION PHACO AND INTRAOCULAR LENS PLACEMENT (IOC) RIGHT TORIC (Right Eye)  Patient location during evaluation: PACU Anesthesia Type: MAC Level of consciousness: awake and alert Pain management: pain level controlled Vital Signs Assessment: post-procedure vital signs reviewed and stable Respiratory status: spontaneous breathing, nonlabored ventilation, respiratory function stable and patient connected to nasal cannula oxygen Cardiovascular status: blood pressure returned to baseline and stable Postop Assessment: no apparent nausea or vomiting Anesthetic complications: no     Last Vitals:  Vitals:   09/08/18 1232 09/08/18 1241  BP: 119/69 126/65  Pulse: 63 62  Resp: 16 16  Temp: 36.7 C   SpO2: 98% 99%    Last Pain:  Vitals:   09/08/18 1241  TempSrc:   PainSc: 0-No pain                 Molli Barrows

## 2019-02-02 DIAGNOSIS — T63481A Toxic effect of venom of other arthropod, accidental (unintentional), initial encounter: Secondary | ICD-10-CM | POA: Diagnosis not present

## 2019-02-08 DIAGNOSIS — I519 Heart disease, unspecified: Secondary | ICD-10-CM | POA: Diagnosis not present

## 2019-02-08 DIAGNOSIS — I2510 Atherosclerotic heart disease of native coronary artery without angina pectoris: Secondary | ICD-10-CM | POA: Diagnosis not present

## 2019-02-08 DIAGNOSIS — I447 Left bundle-branch block, unspecified: Secondary | ICD-10-CM | POA: Diagnosis not present

## 2019-02-08 DIAGNOSIS — E782 Mixed hyperlipidemia: Secondary | ICD-10-CM | POA: Diagnosis not present

## 2019-02-25 ENCOUNTER — Other Ambulatory Visit: Payer: Self-pay

## 2019-02-25 ENCOUNTER — Ambulatory Visit (INDEPENDENT_AMBULATORY_CARE_PROVIDER_SITE_OTHER): Payer: Medicare Other | Admitting: Family Medicine

## 2019-02-25 ENCOUNTER — Encounter: Payer: Self-pay | Admitting: Family Medicine

## 2019-02-25 DIAGNOSIS — I2510 Atherosclerotic heart disease of native coronary artery without angina pectoris: Secondary | ICD-10-CM | POA: Diagnosis not present

## 2019-02-25 DIAGNOSIS — E785 Hyperlipidemia, unspecified: Secondary | ICD-10-CM

## 2019-02-25 DIAGNOSIS — Z08 Encounter for follow-up examination after completed treatment for malignant neoplasm: Secondary | ICD-10-CM | POA: Diagnosis not present

## 2019-02-25 DIAGNOSIS — L821 Other seborrheic keratosis: Secondary | ICD-10-CM | POA: Diagnosis not present

## 2019-02-25 DIAGNOSIS — Z9079 Acquired absence of other genital organ(s): Secondary | ICD-10-CM | POA: Diagnosis not present

## 2019-02-25 DIAGNOSIS — L578 Other skin changes due to chronic exposure to nonionizing radiation: Secondary | ICD-10-CM | POA: Diagnosis not present

## 2019-02-25 DIAGNOSIS — M720 Palmar fascial fibromatosis [Dupuytren]: Secondary | ICD-10-CM | POA: Diagnosis not present

## 2019-02-25 DIAGNOSIS — M545 Low back pain, unspecified: Secondary | ICD-10-CM

## 2019-02-25 DIAGNOSIS — I447 Left bundle-branch block, unspecified: Secondary | ICD-10-CM

## 2019-02-25 DIAGNOSIS — L820 Inflamed seborrheic keratosis: Secondary | ICD-10-CM | POA: Diagnosis not present

## 2019-02-25 DIAGNOSIS — L570 Actinic keratosis: Secondary | ICD-10-CM | POA: Diagnosis not present

## 2019-02-25 DIAGNOSIS — Z720 Tobacco use: Secondary | ICD-10-CM

## 2019-02-25 DIAGNOSIS — D225 Melanocytic nevi of trunk: Secondary | ICD-10-CM | POA: Diagnosis not present

## 2019-02-25 DIAGNOSIS — C493 Malignant neoplasm of connective and soft tissue of thorax: Secondary | ICD-10-CM

## 2019-02-25 DIAGNOSIS — N5231 Erectile dysfunction following radical prostatectomy: Secondary | ICD-10-CM | POA: Diagnosis not present

## 2019-02-25 DIAGNOSIS — I77811 Abdominal aortic ectasia: Secondary | ICD-10-CM

## 2019-02-25 DIAGNOSIS — M79643 Pain in unspecified hand: Secondary | ICD-10-CM | POA: Diagnosis not present

## 2019-02-25 DIAGNOSIS — Z8546 Personal history of malignant neoplasm of prostate: Secondary | ICD-10-CM | POA: Diagnosis not present

## 2019-02-25 DIAGNOSIS — Z85828 Personal history of other malignant neoplasm of skin: Secondary | ICD-10-CM | POA: Diagnosis not present

## 2019-02-25 DIAGNOSIS — Z1283 Encounter for screening for malignant neoplasm of skin: Secondary | ICD-10-CM | POA: Diagnosis not present

## 2019-02-25 DIAGNOSIS — D1800 Hemangioma unspecified site: Secondary | ICD-10-CM | POA: Diagnosis not present

## 2019-02-25 DIAGNOSIS — L812 Freckles: Secondary | ICD-10-CM | POA: Diagnosis not present

## 2019-02-25 MED ORDER — CYCLOBENZAPRINE HCL 10 MG PO TABS: 10.0000 mg | ORAL_TABLET | Freq: Three times a day (TID) | ORAL | 0 refills | Status: AC | PRN

## 2019-02-25 MED ORDER — TADALAFIL 20 MG PO TABS: 10.0000 mg | ORAL_TABLET | ORAL | 11 refills | Status: AC | PRN

## 2019-02-25 NOTE — Progress Notes (Signed)
Name: Jeff Rhodes.   MRN: [id number]    DOB: 1951-08-06   Date:02/25/2019       Progress Note  Subjective  Chief Complaint  Chief Complaint  Patient presents with  . Medication Refill  . Follow-up    I connected with  Jeff Rhodes. on 02/25/19 at  7:40 AM EDT by telephone and verified that I am speaking with the correct person using two identifiers.  I discussed the limitations, risks, security and privacy concerns of performing an evaluation and management service by telephone and the availability of in person appointments. Staff also discussed with the patient that there may be a patient responsible charge related to this service. Patient Location: Home Provider Location: Home Additional Individuals present: None  HPI  Pt presents for follow up:  Liposarcoma Chest Wall/Tobacco Abuse: On annual low dose CT scan to watch this; seeing Dr. Genevive Bi.  Has been stable for 5 years.  It was suggested to have a biopsy or lobectomy both of which he declined.  He has smoked off and on for most of his adult life.  Quit for 1-9 years intemrittently over his lifetime.  Smokes about 1/2-1ppd currently.  He has tried nicotine gum which did not work for him.  He is not interested in Chantix, Wellbutrin, Patches - discussed these today.   LBBB/CAD/dyslipidemia: Monitored by Dr. Alveria Apley office - last visit 02/08/2019, advised to return in 1 year. Denies chest pain, shortness of breath, BLE edema, or palpitations.  Taking ASA and pravachol daily.  History of Adenocarcinoma of the Prostate with prostatectomy/ED: He has history of prostatectomy due to adenocarcinoma in 2013 - he is due for PSA.  Was on Cialis, insurance didn't cover, so he switched to Viagra, would likek to go back to cialis if posisble.   Low back pain s/p Injury: Has history of low back injury, has Xray of lumbar spine from 2015 that showed degenerative changes and chronic L1 compression deformity.  Taking flexeril PRN and  needs refill today.   Patient Active Problem List   Diagnosis Date Noted  . Macular hole of left eye 10/29/2017  . Bradycardia 08/14/2017  . LV dysfunction 07/28/2017  . LBBB (left bundle branch block) 07/28/2017  . Personal history of tobacco use, presenting hazards to health 06/19/2017  . History of adenocarcinoma of prostate 06/19/2017  . Dyslipidemia 06/19/2017  . Encounter for hepatitis C screening test for low risk patient 06/19/2017  . Patient is full code 06/19/2017  . Erectile dysfunction 03/13/2017  . Hx of adenomatous colonic polyps 08/30/2016  . Encounter for follow-up surveillance of prostate cancer 06/17/2016  . Aortic ectasia, abdominal ([place]) 06/17/2016  . Preventative health care 06/17/2016  . Neoplasm of uncertain behavior of skin of face 06/17/2016  . Gout 05/15/2016  . Osteoporosis 05/15/2016  . Encounter for medication monitoring 05/15/2016  . Coronary artery disease 05/15/2016  . Tobacco abuse 05/15/2016  . Mixed hyperlipidemia 12/30/2014  . Inguinal hernia 12/10/2012  . Liposarcoma of chest wall ([place]) 12/12/2010    Family History  Problem Relation Age of Onset  . COPD Mother   . Hypertension Mother   . Osteoporosis Mother   . Skin cancer Father   . Congestive Heart Failure Sister   . Osteoporosis Brother   . COPD Brother     Social History   Socioeconomic History  . Marital status: Married    Spouse name: Not on file  . Number of children: Not on file  .  Years of education: Not on file  . Highest education level: Not on file  Occupational History  . Not on file  Social Needs  . Financial resource strain: Not on file  . Food insecurity    Worry: Not on file    Inability: Not on file  . Transportation needs    Medical: Not on file    Non-medical: Not on file  Tobacco Use  . Smoking status: Current Every Day Smoker    Packs/day: 0.75    Years: 40.00    Pack years: 30.00    Start date: 04/18/2015  . Smokeless tobacco: Former Chief Strategy Officer and Sexual Activity  . Alcohol use: Yes    Alcohol/week: 4.0 - 7.0 standard drinks    Types: 1 - 2 Glasses of wine, 2 - 3 Shots of liquor, 1 - 2 Standard drinks or equivalent per week    Comment: liquor 2-3x weekly wine 1-2 glasses a day  . Drug use: No  . Sexual activity: Yes  Lifestyle  . Physical activity    Days per week: Not on file    Minutes per session: Not on file  . Stress: Not on file  Relationships  . Social Herbalist on phone: Not on file    Gets together: Not on file    Attends religious service: Not on file    Active member of club or organization: Not on file    Attends meetings of clubs or organizations: Not on file    Relationship status: Not on file  . Intimate partner violence    Fear of current or ex partner: Not on file    Emotionally abused: Not on file    Physically abused: Not on file    Forced sexual activity: Not on file  Other Topics Concern  . Not on file  Social History Narrative  . Not on file     Current Outpatient Medications:  .  amoxicillin-clavulanate (AUGMENTIN) 875-125 MG tablet, Take 1 tablet by mouth 2 (two) times daily., Disp: , Rfl:  .  aspirin 81 MG tablet, Take 81 mg by mouth daily. , Disp: , Rfl:  .  cyclobenzaprine (FLEXERIL) 10 MG tablet, Take 1 tablet (10 mg total) by mouth 3 (three) times daily as needed for muscle spasms., Disp: 30 tablet, Rfl: 0 .  fluticasone (FLONASE) 50 MCG/ACT nasal spray, Place into both nostrils daily., Disp: , Rfl:  .  sildenafil (VIAGRA) 100 MG tablet, Take 0.5-1 tablets (50-100 mg total) by mouth daily as needed for erectile dysfunction., Disp: 36 tablet, Rfl: 3  Allergies  Allergen Reactions  . Poison Ivy Extract Rash    I personally reviewed active problem list, medication list, allergies, notes from last encounter, lab results with the patient/caregiver today.   ROS Constitutional: Negative for fever or weight change.  Respiratory: Negative for cough and shortness of  breath.   Cardiovascular: Negative for chest pain or palpitations.  Gastrointestinal: Negative for abdominal pain, no bowel changes.  Musculoskeletal: Negative for gait problem or joint swelling.  Skin: Negative for rash.  Neurological: Negative for dizziness or headache.  No other specific complaints in a complete review of systems (except as listed in HPI above).  Objective  Virtual encounter, vitals not obtained.  There is no height or weight on file to calculate BMI.  Physical Exam  Pulmonary/Chest: Effort normal. No respiratory distress. Speaking in complete sentences Neurological: Pt is alert and oriented to person, place, and time.  Speech is normal Psychiatric: Patient has a normal mood and affect. behavior is normal. Judgment and thought content normal.  No results found for this or any previous visit (from the past 72 hour(s)).  PHQ2/9: Depression screen [facility] 2/9 02/25/2019 06/19/2017 03/07/2017 06/17/2016 05/15/2016  Decreased Interest 0 0 0 0 0  Down, Depressed, Hopeless 0 0 0 0 0  PHQ - 2 Score 0 0 0 0 0  Altered sleeping 0 - - - -  Tired, decreased energy 0 - - - -  Change in appetite 0 - - - -  Feeling bad or failure about yourself  0 - - - -  Trouble concentrating 0 - - - -  Moving slowly or fidgety/restless 0 - - - -  Suicidal thoughts 0 - - - -  PHQ-9 Score 0 - - - -  Difficult doing work/chores Not difficult at all - - - -   PHQ-2/9 Result is negative.    Fall Risk: Fall Risk  02/25/2019 08/24/2018 06/19/2017 03/07/2017 06/17/2016  Falls in the past year? 0 0 No No No  Number falls in past yr: 0 - - - -  Injury with Fall? 0 - - - -  Follow up Falls evaluation completed - - - -    Assessment & Plan  1. Liposarcoma of chest wall ([place]) - Annual follow up with [facility] for Low Dose CT Chest  2. Tobacco abuse - Not ready to quit at this time, may be candidate for Wellbutrin  3. LBBB (left bundle branch block) - Keep follow up with cardiology -  COMPLETE METABOLIC PANEL WITH GFR  4. Coronary artery disease involving native coronary artery of native heart without angina pectoris - Keep follow up with cardiology - COMPLETE METABOLIC PANEL WITH GFR  5. Dyslipidemia - Continue Statin therapy - Keep follow up with cardiology  6. Aortic ectasia, abdominal ([place]) - Keep follow up with cardiology, continue statin therapy  7. History of adenocarcinoma of prostate - tadalafil (CIALIS) 20 MG tablet; Take 0.5-1 tablets (10-20 mg total) by mouth every other day as needed for erectile dysfunction.  Dispense: 10 tablet; Refill: 11  8. History of prostatectomy - PSA  9. Erectile dysfunction after radical prostatectomy - tadalafil (CIALIS) 20 MG tablet; Take 0.5-1 tablets (10-20 mg total) by mouth every other day as needed for erectile dysfunction.  Dispense: 10 tablet; Refill: 11 - PSA  10. Encounter for follow-up surveillance of prostate cancer - PSA  11. Lumbar spine pain - cyclobenzaprine (FLEXERIL) 10 MG tablet; Take 1 tablet (10 mg total) by mouth 3 (three) times daily as needed for muscle spasms.  Dispense: 90 tablet; Refill: 0  I discussed the assessment and treatment plan with the patient. The patient was provided an opportunity to ask questions and all were answered. The patient agreed with the plan and demonstrated an understanding of the instructions.   The patient was advised to call back or seek an in-person evaluation if the symptoms worsen or if the condition fails to improve as anticipated.  I provided 18 minutes of non-face-to-face time during this encounter.  Hubbard Hartshorn, FNP

## 2019-02-26 LAB — COMPLETE METABOLIC PANEL WITH GFR
AG Ratio: 1.7 (calc) (ref 1.0–2.5)
ALT: 12 U/L (ref 9–46)
AST: 17 U/L (ref 10–35)
Albumin: 4 g/dL (ref 3.6–5.1)
Alkaline phosphatase (APISO): 67 U/L (ref 35–144)
BUN: 13 mg/dL (ref 7–25)
CO2: 28 mmol/L (ref 20–32)
Calcium: 9 mg/dL (ref 8.6–10.3)
Chloride: 104 mmol/L (ref 98–110)
Creat: 1.01 mg/dL (ref 0.70–1.18)
GFR, Est African American: 87 mL/min/{1.73_m2} (ref >=60)
GFR, Est Non African American: 75 mL/min/{1.73_m2} (ref >=60)
Globulin: 2.3 g/dL (calc) (ref 1.9–3.7)
Glucose, Bld: 81 mg/dL (ref 65–99)
Potassium: 4.3 mmol/L (ref 3.5–5.3)
Sodium: 139 mmol/L (ref 135–146)
Total Bilirubin: 0.6 mg/dL (ref 0.2–1.2)
Total Protein: 6.3 g/dL (ref 6.1–8.1)

## 2019-02-26 LAB — PSA: PSA: <0.1 ng/mL (ref <=4.0)

## 2019-03-09 DIAGNOSIS — M720 Palmar fascial fibromatosis [Dupuytren]: Secondary | ICD-10-CM | POA: Diagnosis not present

## 2019-03-18 ENCOUNTER — Encounter: Payer: Self-pay | Admitting: Family Medicine

## 2019-04-04 DIAGNOSIS — S20219A Contusion of unspecified front wall of thorax, initial encounter: Secondary | ICD-10-CM | POA: Diagnosis not present

## 2019-04-28 DIAGNOSIS — H35342 Macular cyst, hole, or pseudohole, left eye: Secondary | ICD-10-CM | POA: Diagnosis not present

## 2019-06-25 ENCOUNTER — Other Ambulatory Visit: Payer: Self-pay

## 2019-06-25 ENCOUNTER — Ambulatory Visit (INDEPENDENT_AMBULATORY_CARE_PROVIDER_SITE_OTHER): Payer: Medicare Other

## 2019-06-25 VITALS — Ht 69.0 in | Wt 160.0 lb

## 2019-06-25 DIAGNOSIS — Z0000 Encounter for general adult medical examination without abnormal findings: Secondary | ICD-10-CM

## 2019-06-25 NOTE — Patient Instructions (Signed)
Jeff Rhodes , Thank you for taking time to come for your Medicare Wellness Visit. I appreciate your ongoing commitment to your health goals. Please review the following plan we discussed and let me know if I can assist you in the future.   Screening recommendations/referrals: Colonoscopy: done 11/22/16. Repeat in 2023. Recommended yearly ophthalmology/optometry visit for glaucoma screening and checkup Recommended yearly dental visit for hygiene and checkup  Vaccinations: Influenza vaccine: due Pneumococcal vaccine: due for PPSV23 Tdap vaccine: done 2012 Shingles vaccine: Shingrix discussed. Please contact your pharmacy for coverage information.   Advanced directives: Please bring a copy of your health care power of attorney and living will to the office at your convenience.  Conditions/risks identified: Recommend increasing physical activity  Next appointment: Please follow up in one year for your Medicare Annual Wellness visit.    Preventive Care 16 Years and Older, Male Preventive care refers to lifestyle choices and visits with your health care provider that can promote health and wellness. What does preventive care include?  A yearly physical exam. This is also called an annual well check.  Dental exams once or twice a year.  Routine eye exams. Ask your health care provider how often you should have your eyes checked.  Personal lifestyle choices, including:  Daily care of your teeth and gums.  Regular physical activity.  Eating a healthy diet.  Avoiding tobacco and drug use.  Limiting alcohol use.  Practicing safe sex.  Taking low doses of aspirin every day.  Taking vitamin and mineral supplements as recommended by your health care provider. What happens during an annual well check? The services and screenings done by your health care provider during your annual well check will depend on your age, overall health, lifestyle risk factors, and family history of  disease. Counseling  Your health care provider may ask you questions about your:  Alcohol use.  Tobacco use.  Drug use.  Emotional well-being.  Home and relationship well-being.  Sexual activity.  Eating habits.  History of falls.  Memory and ability to understand (cognition).  Work and work Statistician. Screening  You may have the following tests or measurements:  Height, weight, and BMI.  Blood pressure.  Lipid and cholesterol levels. These may be checked every 5 years, or more frequently if you are over 24 years old.  Skin check.  Lung cancer screening. You may have this screening every year starting at age 35 if you have a 30-pack-year history of smoking and currently smoke or have quit within the past 15 years.  Fecal occult blood test (FOBT) of the stool. You may have this test every year starting at age 57.  Flexible sigmoidoscopy or colonoscopy. You may have a sigmoidoscopy every 5 years or a colonoscopy every 10 years starting at age 23.  Prostate cancer screening. Recommendations will vary depending on your family history and other risks.  Hepatitis C blood test.  Hepatitis B blood test.  Sexually transmitted disease (STD) testing.  Diabetes screening. This is done by checking your blood sugar (glucose) after you have not eaten for a while (fasting). You may have this done every 1-3 years.  Abdominal aortic aneurysm (AAA) screening. You may need this if you are a current or former smoker.  Osteoporosis. You may be screened starting at age 74 if you are at high risk. Talk with your health care provider about your test results, treatment options, and if necessary, the need for more tests. Vaccines  Your health care provider may  recommend certain vaccines, such as:  Influenza vaccine. This is recommended every year.  Tetanus, diphtheria, and acellular pertussis (Tdap, Td) vaccine. You may need a Td booster every 10 years.  Zoster vaccine. You may  need this after age 36.  Pneumococcal 13-valent conjugate (PCV13) vaccine. One dose is recommended after age 63.  Pneumococcal polysaccharide (PPSV23) vaccine. One dose is recommended after age 2. Talk to your health care provider about which screenings and vaccines you need and how often you need them. This information is not intended to replace advice given to you by your health care provider. Make sure you discuss any questions you have with your health care provider. Document Released: 09/15/2015 Document Revised: 05/08/2016 Document Reviewed: 06/20/2015 Elsevier Interactive Patient Education  2017 [place] Prevention in the Home Falls can cause injuries. They can happen to people of all ages. There are many things you can do to make your home safe and to help prevent falls. What can I do on the outside of my home?  Regularly fix the edges of walkways and driveways and fix any cracks.  Remove anything that might make you trip as you walk through a door, such as a raised step or threshold.  Trim any bushes or trees on the path to your home.  Use bright outdoor lighting.  Clear any walking paths of anything that might make someone trip, such as rocks or tools.  Regularly check to see if handrails are loose or broken. Make sure that both sides of any steps have handrails.  Any raised decks and porches should have guardrails on the edges.  Have any leaves, snow, or ice cleared regularly.  Use sand or salt on walking paths during winter.  Clean up any spills in your garage right away. This includes oil or grease spills. What can I do in the bathroom?  Use night lights.  Install grab bars by the toilet and in the tub and shower. Do not use towel bars as grab bars.  Use non-skid mats or decals in the tub or shower.  If you need to sit down in the shower, use a plastic, non-slip stool.  Keep the floor dry. Clean up any water that spills on the floor as soon as it  happens.  Remove soap buildup in the tub or shower regularly.  Attach bath mats securely with double-sided non-slip rug tape.  Do not have throw rugs and other things on the floor that can make you trip. What can I do in the bedroom?  Use night lights.  Make sure that you have a light by your bed that is easy to reach.  Do not use any sheets or blankets that are too big for your bed. They should not hang down onto the floor.  Have a firm chair that has side arms. You can use this for support while you get dressed.  Do not have throw rugs and other things on the floor that can make you trip. What can I do in the kitchen?  Clean up any spills right away.  Avoid walking on wet floors.  Keep items that you use a lot in easy-to-reach places.  If you need to reach something above you, use a strong step stool that has a grab bar.  Keep electrical cords out of the way.  Do not use floor polish or wax that makes floors slippery. If you must use wax, use non-skid floor wax.  Do not have throw rugs and  other things on the floor that can make you trip. What can I do with my stairs?  Do not leave any items on the stairs.  Make sure that there are handrails on both sides of the stairs and use them. Fix handrails that are broken or loose. Make sure that handrails are as long as the stairways.  Check any carpeting to make sure that it is firmly attached to the stairs. Fix any carpet that is loose or worn.  Avoid having throw rugs at the top or bottom of the stairs. If you do have throw rugs, attach them to the floor with carpet tape.  Make sure that you have a light switch at the top of the stairs and the bottom of the stairs. If you do not have them, ask someone to add them for you. What else can I do to help prevent falls?  Wear shoes that:  Do not have high heels.  Have rubber bottoms.  Are comfortable and fit you well.  Are closed at the toe. Do not wear sandals.  If you  use a stepladder:  Make sure that it is fully opened. Do not climb a closed stepladder.  Make sure that both sides of the stepladder are locked into place.  Ask someone to hold it for you, if possible.  Clearly mark and make sure that you can see:  Any grab bars or handrails.  First and last steps.  Where the edge of each step is.  Use tools that help you move around (mobility aids) if they are needed. These include:  Canes.  Walkers.  Scooters.  Crutches.  Turn on the lights when you go into a dark area. Replace any light bulbs as soon as they burn out.  Set up your furniture so you have a clear path. Avoid moving your furniture around.  If any of your floors are uneven, fix them.  If there are any pets around you, be aware of where they are.  Review your medicines with your doctor. Some medicines can make you feel dizzy. This can increase your chance of falling. Ask your doctor what other things that you can do to help prevent falls. This information is not intended to replace advice given to you by your health care provider. Make sure you discuss any questions you have with your health care provider. Document Released: 06/15/2009 Document Revised: 01/25/2016 Document Reviewed: 09/23/2014 Elsevier Interactive Patient Education  2017 [employer].

## 2019-06-25 NOTE — Progress Notes (Addendum)
Subjective:   Jeff Rhodes. is a 68 y.o. male who presents for Medicare Annual/Subsequent preventive examination.  Virtual Visit via Telephone Note  I connected with Jeff Rhodes. on 06/25/19 at  9:20 AM EDT by telephone and verified that I am speaking with the correct person using two identifiers.  Medicare Annual Wellness visit completed telephonically due to Covid-19 pandemic.   Location: Patient: home Provider: office   I discussed the limitations, risks, security and privacy concerns of performing an evaluation and management service by telephone and the availability of in person appointments. The patient expressed understanding and agreed to proceed.  Some vital signs may be absent or patient reported.   Clemetine Marker, LPN    Review of Systems:   Cardiac Risk Factors include: advanced age (>16men, >64 women);dyslipidemia;male gender;smoking/ tobacco exposure     Objective:    Vitals: Ht 5\' 9"  (1.753 m)   Wt 160 lb (72.6 kg)   BMI 23.63 kg/m   Body mass index is 23.63 kg/m.  Advanced Directives 06/25/2019 09/08/2018 08/18/2018 06/19/2017 03/07/2017 11/22/2016 06/17/2016  Does Patient Have a Medical Advance Directive? Yes - Yes No;Yes Yes Yes No  Type of Paramedic of [place];Living will [place];Living will [place] will -  Does patient want to make changes to medical advance directive? - No - Patient declined No - Patient declined - - - -  Copy of [place] in Chart? No - copy requested No - copy requested No - copy requested - - - -  Would patient like information on creating a medical advance directive? - - - - - - No - patient declined information    Tobacco Social History   Tobacco Use  Smoking Status Current Every Day Smoker  . Packs/day: 0.75  . Years: 40.00  . Pack years: 30.00  . Types: Cigarettes  . Start date: 04/18/2015  Smokeless Tobacco  Former [place] given on [place] smoking cessation program     Ready to quit: Yes Counseling given: Yes Comment: info given on [place] smoking cessation program   Clinical Intake:  Pre-visit preparation completed: Yes  Pain : No/denies pain     BMI - recorded: 23.63 Nutritional Status: BMI of 19-24  Normal Nutritional Risks: None Diabetes: No  How often do you need to have someone help you when you read instructions, pamphlets, or other written materials from your doctor or pharmacy?: 1 - Never  Interpreter Needed?: No  Information entered by :: Clemetine Marker LPN  Past Medical History:  Diagnosis Date  . Cataract   . Cellulitis and abscess of trunk   . Coronary artery disease   . Dysrhythmia    LBBB  . Gout 2011  . Hernia   . HOH (hard of hearing)   . Inguinal hernia 12/10/2012   Right inguinal hernia repaired making use of large ultra Pro mesh April 2014.   . Macular hole of left eye 10/29/2017   [facility] Feb 2019  . Malignant neoplasm of connective and other soft tissue of thorax   . Neoplasm of uncertain behavior of skin   . Other benign neoplasm of connective and other soft tissue of thorax   . Personal history of tobacco use, presenting hazards to health   . Prostate cancer ([place]) 2011  . Rib fracture   . Special screening for malignant neoplasms, colon   .  Unspecified essential hypertension    pt denies    Family History  Problem Relation Age of Onset  . COPD Mother   . Hypertension Mother   . Osteoporosis Mother   . Skin cancer Father   . Congestive Heart Failure Sister   . Osteoporosis Brother   . COPD Brother    Social History   Socioeconomic History  . Marital status: Married    Spouse name: Not on file  . Number of children: 1  . Years of education: Not on file  . Highest education level: Not on file  Occupational History  . Not on file  Social Needs  . Financial resource strain: Not hard at all  . Food insecurity     Worry: Never true    Inability: Never true  . Transportation needs    Medical: No    Non-medical: No  Tobacco Use  . Smoking status: Current Every Day Smoker    Packs/day: 0.75    Years: 40.00    Pack years: 30.00    Types: Cigarettes    Start date: 04/18/2015  . Smokeless tobacco: Former Systems developer  . Tobacco comment: info given on [place] smoking cessation program  Substance and Sexual Activity  . Alcohol use: Yes    Alcohol/week: 4.0 - 7.0 standard drinks    Types: 1 - 2 Glasses of wine, 2 - 3 Shots of liquor, 1 - 2 Standard drinks or equivalent per week    Comment: liquor 2-3x weekly wine 1-2 glasses a day  . Drug use: No  . Sexual activity: Yes  Lifestyle  . Physical activity    Days per week: 0 days    Minutes per session: 0 min  . Stress: Only a little  Relationships  . Social Herbalist on phone: Patient refused    Gets together: Patient refused    Attends religious service: Patient refused    Active member of club or organization: Patient refused    Attends meetings of clubs or organizations: Patient refused    Relationship status: Married  Other Topics Concern  . Not on file  Social History Narrative  . Not on file    Outpatient Encounter Medications as of 06/25/2019  Medication Sig  . aspirin 81 MG tablet Take 81 mg by mouth daily.   . pravastatin (PRAVACHOL) 20 MG tablet Take 20 mg by mouth daily.  . cyclobenzaprine (FLEXERIL) 10 MG tablet Take 1 tablet (10 mg total) by mouth 3 (three) times daily as needed for muscle spasms. (Patient not taking: Reported on 06/25/2019)  . tadalafil (CIALIS) 20 MG tablet Take 0.5-1 tablets (10-20 mg total) by mouth every other day as needed for erectile dysfunction. (Patient not taking: Reported on 06/25/2019)  . [DISCONTINUED] fluticasone (FLONASE) 50 MCG/ACT nasal spray Place into both nostrils daily.  . [DISCONTINUED] sildenafil (VIAGRA) 100 MG tablet Take 0.5-1 tablets (50-100 mg total) by mouth daily as  needed for erectile dysfunction.   No facility-administered encounter medications on file as of 06/25/2019.     Activities of Daily Living In your present state of health, do you have any difficulty performing the following activities: 06/25/2019 09/08/2018  Hearing? N N  Comment declines hearing aids -  Vision? N N  Difficulty concentrating or making decisions? N N  Walking or climbing stairs? N N  Dressing or bathing? N N  Doing errands, shopping? N -  Preparing Food and eating ? N -  Using the Toilet? N -  In the past six months, have you accidently leaked urine? N -  Do you have problems with loss of bowel control? N -  Managing your Medications? N -  Managing your Finances? N -  Housekeeping or managing your Housekeeping? N -  Some recent data might be hidden    Patient Care Team: Lada, Satira Anis, MD as PCP - General (Family Medicine) Bary Castilla, Forest Gleason, MD as Consulting Physician (General Surgery) Alroy Dust, MD as Referring Physician (Urology) Corey Skains, MD as Consulting Physician (Cardiology) Ralene Bathe, MD (Dermatology) Manya Silvas, MD (Gastroenterology)   Assessment:   This is a routine wellness examination for [facility].  Exercise Activities and Dietary recommendations Current Exercise Habits: Home exercise routine, Type of exercise: Other - see comments(working in the yard and around the house. not consistent.), Exercise limited by: None identified  Goals    . Increase physical activity     Start slow and gradually build up       Fall Risk Fall Risk  06/25/2019 02/25/2019 08/24/2018 06/19/2017 03/07/2017  Falls in the past year? 0 0 0 No No  Number falls in past yr: 0 0 - - -  Injury with Fall? 0 0 - - -  Follow up Falls prevention discussed Falls evaluation completed - - -   FALL RISK PREVENTION PERTAINING TO THE HOME:  Any stairs in or around the home? Yes  If so, do they handrails? Yes   Home free of loose throw rugs in  walkways, pet beds, electrical cords, etc? Yes  Adequate lighting in your home to reduce risk of falls? Yes   ASSISTIVE DEVICES UTILIZED TO PREVENT FALLS:  Life alert? No  Use of a cane, walker or w/c? No  Grab bars in the bathroom? Yes  Shower chair or bench in shower? Yes  Elevated toilet seat or a handicapped toilet? No   DME ORDERS:  DME order needed?  No   TIMED UP AND GO:  Was the test performed? No . Telephonic visit.   Education: Fall risk prevention has been discussed.  Intervention(s) required? No   Depression Screen PHQ 2/9 Scores 06/25/2019 02/25/2019 06/19/2017 03/07/2017  PHQ - 2 Score 0 0 0 0  PHQ- 9 Score - 0 - -    Cognitive Function - pt declined 6CIT for 2020 AWV.      6CIT Screen 06/19/2017  What Year? 0 points  What month? 0 points  What time? 0 points  Count back from 20 0 points  Months in reverse 0 points  Repeat phrase 0 points  Total Score 0    Immunization History  Administered Date(s) Administered  . Influenza, High Dose Seasonal PF 05/15/2016, 06/19/2017    Qualifies for Shingles Vaccine? Yes  . Due for Shingrix. Education has been provided regarding the importance of this vaccine. Pt has been advised to call insurance company to determine out of pocket expense. Advised may also receive vaccine at local pharmacy or Health Dept. Verbalized acceptance and understanding.  Tdap: Up to date  Flu Vaccine: Due for Flu vaccine. Does the patient want to receive this vaccine today?  No . Education has been provided regarding the importance of this vaccine but still declined. Advised may receive this vaccine at local pharmacy or Health Dept. Aware to provide a copy of the vaccination record if obtained from local pharmacy or Health Dept. Verbalized acceptance and understanding.  Pneumococcal Vaccine: Due for Pneumococcal vaccine. Does the patient want to receive  this vaccine today?  No . Education has been provided regarding the importance of this  vaccine but still declined. Advised may receive this vaccine at local pharmacy or Health Dept. Aware to provide a copy of the vaccination record if obtained from local pharmacy or Health Dept. Verbalized acceptance and understanding.   Screening Tests Health Maintenance  Topic Date Due  . PNA vac Low Risk Adult (1 of 2 - PCV13) 11/05/2013  . INFLUENZA VACCINE  04/03/2019  . TETANUS/TDAP  09/02/2020  . COLONOSCOPY  11/22/2021  . Hepatitis C Screening  Completed   Cancer Screenings:  Colorectal Screening: Completed 11/22/16. Repeat every 5 years.  Lung Cancer Screening: (Low Dose CT Chest recommended if Age 90-80 years, 30 pack-year currently smoking OR have quit w/in 15years.) does qualify.   Lung Cancer Screening Referral: An Epic message has been sent to Burgess Estelle, RN (Oncology Nurse Navigator) regarding the possible need for this exam. Raquel Sarna will review the patient's chart to determine if the patient truly qualifies for the exam. If the patient qualifies, Raquel Sarna will order the Low Dose CT of the chest to facilitate the scheduling of this exam.  Additional Screening:  Hepatitis C Screening: does qualify; Completed 06/19/17.  Vision Screening: Recommended annual ophthalmology exams for early detection of glaucoma and other disorders of the eye. Is the patient up to date with their annual eye exam?  Yes  Who is the provider or what is the name of the office in which the pt attends annual eye exams? [place] Screening: Recommended annual dental exams for proper oral hygiene  Community Resource Referral:  CRR required this visit?  No       Plan:    I have personally reviewed and addressed the Medicare Annual Wellness questionnaire and have noted the following in the patient's chart:  A. Medical and social history B. Use of alcohol, tobacco or illicit drugs  C. Current medications and supplements D. Functional ability and status E.  Nutritional status F.   Physical activity G. Advance directives H. List of other physicians I.  Hospitalizations, surgeries, and ER visits in previous 12 months J.  [place] such as hearing and vision if needed, cognitive and depression L. Referrals and appointments   In addition, I have reviewed and discussed with patient certain preventive protocols, quality metrics, and best practice recommendations. A written personalized care plan for preventive services as well as general preventive health recommendations were provided to patient.   Signed,  Clemetine Marker, LPN Nurse Health Advisor   Nurse Notes: pt states he needs a prior authorization for cialis rx due to hx of prostate removal. Advised patient he needs an office visit and past due for labs and would need to discuss then. Pt states he will call to schedule an appointment to be seen.

## 2019-07-02 ENCOUNTER — Telehealth: Payer: Self-pay | Admitting: *Deleted

## 2019-07-02 DIAGNOSIS — Z122 Encounter for screening for malignant neoplasm of respiratory organs: Secondary | ICD-10-CM

## 2019-07-02 DIAGNOSIS — Z87891 Personal history of nicotine dependence: Secondary | ICD-10-CM

## 2019-07-02 NOTE — Telephone Encounter (Signed)
Patient has been notified that annual lung cancer screening low dose CT scan is due currently or will be in near future. Confirmed that patient is within the age range of 55-77, and asymptomatic, (no signs or symptoms of lung cancer). Patient denies illness that would prevent curative treatment for lung cancer if found. Verified smoking history, (current, 31.5 pack year). The shared decision making visit was done 07/15/17. Patient is agreeable for CT scan being scheduled.

## 2019-07-28 ENCOUNTER — Other Ambulatory Visit: Payer: Self-pay

## 2019-07-28 DIAGNOSIS — R911 Solitary pulmonary nodule: Secondary | ICD-10-CM

## 2019-08-13 ENCOUNTER — Ambulatory Visit: Admission: RE | Admit: 2019-08-13 | Payer: Medicare Other | Source: Ambulatory Visit

## 2019-08-13 ENCOUNTER — Ambulatory Visit: Payer: Medicare Other | Admitting: Cardiothoracic Surgery

## 2019-08-13 ENCOUNTER — Telehealth: Payer: Self-pay

## 2019-08-13 NOTE — Telephone Encounter (Signed)
Patient missed scheduled appointment today 08/13/19 with CT and follow up appointment with Dr.Oaks. Jeff Rhodes rescheduled pt for 08/31/19 at 9:00am with Dr.Oaks. Per Dr.Oaks he recommends pt reschedule to have CT completed. Pt is rescheduled for CT for 08/30/19 at 1:30p arrival time of 1:15p. Patient verbalized understanding.

## 2019-08-25 ENCOUNTER — Inpatient Hospital Stay: Admission: RE | Admit: 2019-08-25 | Payer: Medicare Other | Source: Ambulatory Visit

## 2019-08-30 ENCOUNTER — Ambulatory Visit: Payer: Medicare Other | Attending: Cardiothoracic Surgery

## 2019-08-31 ENCOUNTER — Ambulatory Visit: Payer: Medicare Other | Admitting: Cardiothoracic Surgery

## 2019-08-31 ENCOUNTER — Encounter: Payer: Self-pay | Admitting: Cardiothoracic Surgery

## 2019-11-18 ENCOUNTER — Encounter: Payer: Self-pay | Admitting: *Deleted

## 2019-11-23 ENCOUNTER — Telehealth: Payer: Self-pay

## 2019-11-23 ENCOUNTER — Telehealth: Payer: Self-pay | Admitting: *Deleted

## 2019-11-23 NOTE — Telephone Encounter (Signed)
(  11/23/19) Left message for pt to notify them that it is time to schedule annual low dose lung cancer screening CT scan. Instructed patient to call back to verify information prior to the scan being scheduled °SRW °  ° ° ° °

## 2019-11-23 NOTE — Telephone Encounter (Signed)
Message left for the patient to call back. He missed his CT chest in December. Calling to get him rescheduled for this and also set up to follow up with Dr Genevive Bi afterwards.

## 2019-12-04 ENCOUNTER — Telehealth: Payer: Self-pay

## 2019-12-04 NOTE — Telephone Encounter (Signed)
Message left notifying patient that it is time to schedule the low dose lung cancer screening CT scan.  Instructed patient to return call to Shawn Perkins at [phone] to verify information prior to CT scan being scheduled.    

## 2020-03-30 ENCOUNTER — Encounter: Payer: Self-pay | Admitting: *Deleted

## 2020-06-29 ENCOUNTER — Ambulatory Visit: Payer: Medicare Other

## 2022-01-30 ENCOUNTER — Telehealth: Payer: Self-pay

## 2022-01-30 NOTE — Telephone Encounter (Signed)
Patient called this AM wanting an appointment for growths on his face. We offered patient two appointments, one at 10am and one at 10:45 from cancellations. Patient is unable to make these times. Suggested to patient that he continue to call our office for cancellations or call PCP to have these areas checked. Patient has not been in our office in over three years. Patient scheduled TBSE for December.   Patient then asked if he could email photos of moles. Advised patient we do not have access to do this anymore. He would have to send a MyChart message but even then Dr. Nehemiah Massed would need to see these growths in person.   Patient stated for me to have a great day and I didn't want to do anything to help him out and hung up.

## 2022-08-05 ENCOUNTER — Ambulatory Visit (INDEPENDENT_AMBULATORY_CARE_PROVIDER_SITE_OTHER): Payer: Medicare Other | Admitting: Dermatology

## 2022-08-05 ENCOUNTER — Encounter: Payer: Self-pay | Admitting: Dermatology

## 2022-08-05 VITALS — BP 107/63 | HR 69

## 2022-08-05 DIAGNOSIS — D692 Other nonthrombocytopenic purpura: Secondary | ICD-10-CM | POA: Diagnosis not present

## 2022-08-05 DIAGNOSIS — Z1283 Encounter for screening for malignant neoplasm of skin: Secondary | ICD-10-CM | POA: Diagnosis not present

## 2022-08-05 DIAGNOSIS — C44311 Basal cell carcinoma of skin of nose: Secondary | ICD-10-CM | POA: Diagnosis not present

## 2022-08-05 DIAGNOSIS — L578 Other skin changes due to chronic exposure to nonionizing radiation: Secondary | ICD-10-CM | POA: Diagnosis not present

## 2022-08-05 DIAGNOSIS — L820 Inflamed seborrheic keratosis: Secondary | ICD-10-CM | POA: Diagnosis not present

## 2022-08-05 DIAGNOSIS — D492 Neoplasm of unspecified behavior of bone, soft tissue, and skin: Secondary | ICD-10-CM

## 2022-08-05 DIAGNOSIS — Z79899 Other long term (current) drug therapy: Secondary | ICD-10-CM

## 2022-08-05 DIAGNOSIS — L719 Rosacea, unspecified: Secondary | ICD-10-CM | POA: Diagnosis not present

## 2022-08-05 DIAGNOSIS — L570 Actinic keratosis: Secondary | ICD-10-CM

## 2022-08-05 DIAGNOSIS — L814 Other melanin hyperpigmentation: Secondary | ICD-10-CM

## 2022-08-05 DIAGNOSIS — C4491 Basal cell carcinoma of skin, unspecified: Secondary | ICD-10-CM

## 2022-08-05 DIAGNOSIS — Z5111 Encounter for antineoplastic chemotherapy: Secondary | ICD-10-CM

## 2022-08-05 DIAGNOSIS — Z85828 Personal history of other malignant neoplasm of skin: Secondary | ICD-10-CM

## 2022-08-05 DIAGNOSIS — L821 Other seborrheic keratosis: Secondary | ICD-10-CM

## 2022-08-05 DIAGNOSIS — D229 Melanocytic nevi, unspecified: Secondary | ICD-10-CM

## 2022-08-05 HISTORY — DX: Basal cell carcinoma of skin, unspecified: C44.91

## 2022-08-05 NOTE — Progress Notes (Signed)
New Patient Visit  Subjective  Jeff Rhodes. is a 71 y.o. male who presents for the following: Annual Exam (Hx of BCC's in the past. Hx of AKs). The patient presents for Total-Body Skin Exam (TBSE) for skin cancer screening and mole check.  The patient has spots, moles and lesions to be evaluated, some may be new or changing and the patient has concerns that these could be cancer.  Review of Systems: No other skin or systemic complaints except as noted in HPI or Assessment and Plan.  Objective  Well appearing patient in no apparent distress; mood and affect are within normal limits.  A full examination was performed including scalp, head, eyes, ears, nose, lips, neck, chest, axillae, abdomen, back, buttocks, bilateral upper extremities, bilateral lower extremities, hands, feet, fingers, toes, fingernails, and toenails. All findings within normal limits unless otherwise noted below.  face Mid face erythema with telangiectasias   face x20 (20) Erythematous thin papules/macules with gritty scale.   Left top of Shoulder x1, left lower lip (2) Erythematous keratotic or waxy stuck-on papule or plaque.  Left tip of nose 1cm x 1.1 cm pink atrophic plaque      Assessment & Plan   History of Basal Cell Carcinoma of the Skin - No evidence of recurrence today - Recommend regular full body skin exams - Recommend daily broad spectrum sunscreen SPF 30+ to sun-exposed areas, reapply every 2 hours as needed.  - Call if any new or changing lesions are noted between office visits  Lentigines - Scattered tan macules - Due to sun exposure - Benign-appearing, observe - Recommend daily broad spectrum sunscreen SPF 30+ to sun-exposed areas, reapply every 2 hours as needed. - Call for any changes  Seborrheic Keratoses - Stuck-on, waxy, tan-brown papules and/or plaques  - Benign-appearing - Discussed benign etiology and prognosis. - Observe - Call for any changes  Melanocytic  [place]i - Tan-brown and/or pink-flesh-colored symmetric macules and papules - Benign appearing on exam today - Observation - Call clinic for new or changing moles - Recommend daily use of broad spectrum spf 30+ sunscreen to sun-exposed areas.   Hemangiomas - Red papules - Discussed benign nature - Observe - Call for any changes  Actinic Damage with PreCancerous Actinic Keratoses Counseling for Topical Chemotherapy Management: Patient exhibits: - Severe, confluent actinic changes with pre-cancerous actinic keratoses that is secondary to cumulative UV radiation exposure over time - Condition that is severe; chronic, not at goal. - diffuse scaly erythematous macules and papules with underlying dyspigmentation - Discussed Prescription "Field Treatment" topical Chemotherapy for Severe, Chronic Confluent Actinic Changes with Pre-Cancerous Actinic Keratoses Field treatment involves treatment of an entire area of skin that has confluent Actinic Changes (Sun/ Ultraviolet light damage) and PreCancerous Actinic Keratoses by method of PhotoDynamic Therapy (PDT) and/or prescription Topical Chemotherapy agents such as 5-fluorouracil, 5-fluorouracil/calcipotriene, and/or imiquimod.  The purpose is to decrease the number of clinically evident and subclinical PreCancerous lesions to prevent progression to development of skin cancer by chemically destroying early precancer changes that may or may not be visible.  It has been shown to reduce the risk of developing skin cancer in the treated area. As a result of treatment, redness, scaling, crusting, and open sores may occur during treatment course. One or more than one of these methods may be used and may have to be used several times to control, suppress and eliminate the PreCancerous changes. Discussed treatment course, expected reaction, and possible side effects. - Recommend daily broad  spectrum sunscreen SPF 30+ to sun-exposed areas, reapply every 2 hours as  needed.  - Staying in the shade or wearing long sleeves, sun glasses (UVA+UVB protection) and wide brim hats (4-inch brim around the entire circumference of the hat) are also recommended. - Call for new or changing lesions.  Skin cancer screening performed today.  Purpura - Chronic; persistent and recurrent.  Treatable, but not curable. Arms - Violaceous macules and patches - Benign - Related to trauma, age, sun damage and/or use of blood thinners, chronic use of topical and/or oral steroids - Observe - Can use OTC arnica containing moisturizer such as Dermend Bruise Formula if desired - Call for worsening or other concerns  Rosacea face  Rosacea is a chronic progressive skin condition usually affecting the face of adults, causing redness and/or acne bumps. It is treatable but not curable. It sometimes affects the eyes (ocular rosacea) as well. It may respond to topical and/or systemic medication and can flare with stress, sun exposure, alcohol, exercise, topical steroids (including hydrocortisone/cortisone 10) and some foods.  Daily application of broad spectrum spf 30+ sunscreen to face is recommended to reduce flares.  Discussed the treatment option of BBL/laser.  Typically we recommend 1-3 treatment sessions about 5-8 weeks apart for best results.  The patient's condition may require "maintenance treatments" in the future.  The fee for BBL / laser treatments is $350 per treatment session for the whole face.  A fee can be quoted for other parts of the body. Insurance typically does not pay for BBL/laser treatments and therefore the fee is an out-of-pocket cost.   AK (actinic keratosis) (20) face x20  Actinic keratoses are precancerous spots that appear secondary to cumulative UV radiation exposure/sun exposure over time. They are chronic with expected duration over 1 year. A portion of actinic keratoses will progress to squamous cell carcinoma of the skin. It is not possible to reliably  predict which spots will progress to skin cancer and so treatment is recommended to prevent development of skin cancer.  Recommend daily broad spectrum sunscreen SPF 30+ to sun-exposed areas, reapply every 2 hours as needed.  Recommend staying in the shade or wearing long sleeves, sun glasses (UVA+UVB protection) and wide brim hats (4-inch brim around the entire circumference of the hat). Call for new or changing lesions.  Destruction of lesion - face x20 Complexity: simple   Destruction method: cryotherapy   Informed consent: discussed and consent obtained   Timeout:  patient name, date of birth, surgical site, and procedure verified Lesion destroyed using liquid nitrogen: Yes   Region frozen until ice ball extended beyond lesion: Yes   Outcome: patient tolerated procedure well with no complications   Post-procedure details: wound care instructions given   Additional details:  Prior to procedure, discussed risks of blister formation, small wound, skin dyspigmentation, or rare scar following cryotherapy. Recommend Vaseline ointment to treated areas while healing.   Inflamed seborrheic keratosis (2) Left top of Shoulder x1, left lower lip  Symptomatic, irritating, patient would like treated.  Recheck on follow up  Destruction of lesion - Left top of Shoulder x1, left lower lip Complexity: simple   Destruction method: cryotherapy   Informed consent: discussed and consent obtained   Timeout:  patient name, date of birth, surgical site, and procedure verified Lesion destroyed using liquid nitrogen: Yes   Region frozen until ice ball extended beyond lesion: Yes   Outcome: patient tolerated procedure well with no complications   Post-procedure details: wound care  instructions given   Additional details:  Prior to procedure, discussed risks of blister formation, small wound, skin dyspigmentation, or rare scar following cryotherapy. Recommend Vaseline ointment to treated areas while healing.    Neoplasm of skin Left tip of nose  Skin / nail biopsy Type of biopsy: tangential   Informed consent: discussed and consent obtained   Timeout: patient name, date of birth, surgical site, and procedure verified   Procedure prep:  Patient was prepped and draped in usual sterile fashion Prep type:  Isopropyl alcohol Anesthesia: the lesion was anesthetized in a standard fashion   Anesthetic:  1% lidocaine w/ epinephrine 1-100,000 buffered w/ 8.4% NaHCO3 Instrument used: flexible razor blade   Hemostasis achieved with: pressure, aluminum chloride and electrodesiccation   Outcome: patient tolerated procedure well   Post-procedure details: sterile dressing applied and wound care instructions given   Dressing type: bandage and petrolatum    Specimen 1 - Surgical pathology Differential Diagnosis: R/O BCC  Check Margins: No  Consider Mohs (vs radiation) if +skin cancer.   Return in about 2 months (around 10/06/2022) for AK Follow Up.  I, Emelia Salisbury, CMA, am acting as scribe for Sarina Ser, MD. Documentation: I have reviewed the above documentation for accuracy and completeness, and I agree with the above.  Sarina Ser, MD

## 2022-08-05 NOTE — Patient Instructions (Addendum)
Cryotherapy Aftercare  Wash gently with soap and water everyday.   Apply Vaseline and Band-Aid daily until healed.     Wound Care Instructions  Cleanse wound gently with soap and water once a day then pat dry with clean gauze. Apply a thin coat of Petrolatum (petroleum jelly, "Vaseline") over the wound (unless you have an allergy to this). We recommend that you use a new, sterile tube of Vaseline. Do not pick or remove scabs. Do not remove the yellow or white "healing tissue" from the base of the wound.  Cover the wound with fresh, clean, nonstick gauze and secure with paper tape. You may use Band-Aids in place of gauze and tape if the wound is small enough, but would recommend trimming much of the tape off as there is often too much. Sometimes Band-Aids can irritate the skin.  You should call the office for your biopsy report after 1 week if you have not already been contacted.  If you experience any problems, such as abnormal amounts of bleeding, swelling, significant bruising, significant pain, or evidence of infection, please call the office immediately.  FOR ADULT SURGERY PATIENTS: If you need something for pain relief you may take 1 extra strength Tylenol (acetaminophen) AND 2 Ibuprofen (200mg each) together every 4 hours as needed for pain. (do not take these if you are allergic to them or if you have a reason you should not take them.) Typically, you may only need pain medication for 1 to 3 days.     Recommend daily broad spectrum sunscreen SPF 30+ to sun-exposed areas, reapply every 2 hours as needed. Call for new or changing lesions.  Staying in the shade or wearing long sleeves, sun glasses (UVA+UVB protection) and wide brim hats (4-inch brim around the entire circumference of the hat) are also recommended for sun protection.    Melanoma ABCDEs  Melanoma is the most dangerous type of skin cancer, and is the leading cause of death from skin disease.  You are more likely to  develop melanoma if you: Have light-colored skin, light-colored eyes, or red or blond hair Spend a lot of time in the sun Tan regularly, either outdoors or in a tanning bed Have had blistering sunburns, especially during childhood Have a close family member who has had a melanoma Have atypical moles or large birthmarks  Early detection of melanoma is key since treatment is typically straightforward and cure rates are extremely high if we catch it early.   The first sign of melanoma is often a change in a mole or a new dark spot.  The ABCDE system is a way of remembering the signs of melanoma.  A for asymmetry:  The two halves do not match. B for border:  The edges of the growth are irregular. C for color:  A mixture of colors are present instead of an even brown color. D for diameter:  Melanomas are usually (but not always) greater than 6mm - the size of a pencil eraser. E for evolution:  The spot keeps changing in size, shape, and color.  Please check your skin once per month between visits. You can use a small mirror in front and a large mirror behind you to keep an eye on the back side or your body.   If you see any new or changing lesions before your next follow-up, please call to schedule a visit.  Please continue daily skin protection including broad spectrum sunscreen SPF 30+ to sun-exposed areas, reapplying every 2   hours as needed when you're outdoors.   Staying in the shade or wearing long sleeves, sun glasses (UVA+UVB protection) and wide brim hats (4-inch brim around the entire circumference of the hat) are also recommended for sun protection.    Due to recent changes in healthcare laws, you may see results of your pathology and/or laboratory studies on MyChart before the doctors have had a chance to review them. We understand that in some cases there may be results that are confusing or concerning to you. Please understand that not all results are received at the same time and  often the doctors may need to interpret multiple results in order to provide you with the best plan of care or course of treatment. Therefore, we ask that you please give us 2 business days to thoroughly review all your results before contacting the office for clarification. Should we see a critical lab result, you will be contacted sooner.   If You Need Anything After Your Visit  If you have any questions or concerns for your doctor, please call our main line at [phone] and press option 4 to reach your doctor's medical assistant. If no one answers, please leave a voicemail as directed and we will return your call as soon as possible. Messages left after 4 pm will be answered the following business day.   You may also send us a message via MyChart. We typically respond to MyChart messages within 1-2 business days.  For prescription refills, please ask your pharmacy to contact our office. Our fax number is [phone].  If you have an urgent issue when the clinic is closed that cannot wait until the next business day, you can page your doctor at the number below.    Please note that while we do our best to be available for urgent issues outside of office hours, we are not available 24/7.   If you have an urgent issue and are unable to reach us, you may choose to seek medical care at your doctor's office, retail clinic, urgent care center, or emergency room.  If you have a medical emergency, please immediately call 911 or go to the emergency department.  Pager Numbers  - Dr. Kowalski: [phone]  - Dr. Moye: [phone]  - Dr. Stewart: [phone]  In the event of inclement weather, please call our main line at [phone] for an update on the status of any delays or closures.  Dermatology Medication Tips: Please keep the boxes that topical medications come in in order to help keep track of the instructions about where and how to use these. Pharmacies typically print the  medication instructions only on the boxes and not directly on the medication tubes.   If your medication is too expensive, please contact our office at [phone] option 4 or send us a message through MyChart.   We are unable to tell what your co-pay for medications will be in advance as this is different depending on your insurance coverage. However, we may be able to find a substitute medication at lower cost or fill out paperwork to get insurance to cover a needed medication.   If a prior authorization is required to get your medication covered by your insurance company, please allow us 1-2 business days to complete this process.  Drug prices often vary depending on where the prescription is filled and some pharmacies may offer cheaper prices.  The website www.goodrx.com contains coupons for medications through different pharmacies. The prices here do not account for   what the cost may be with help from insurance (it may be cheaper with your insurance), but the website can give you the price if you did not use any insurance.  - You can print the associated coupon and take it with your prescription to the pharmacy.  - You may also stop by our office during regular business hours and pick up a GoodRx coupon card.  - If you need your prescription sent electronically to a different pharmacy, notify our office through [facility] MyChart or by phone at [phone] option 4.     Si Usted Necesita Algo Despus de Su Visita  Tambin puede enviarnos un mensaje a travs de MyChart. Por lo general respondemos a los mensajes de MyChart en el transcurso de 1 a 2 das hbiles.  Para renovar recetas, por favor pida a su farmacia que se ponga en contacto con nuestra oficina. Nuestro nmero de fax es el [phone].  Si tiene un asunto urgente cuando la clnica est cerrada y que no puede esperar hasta el siguiente da hbil, puede llamar/localizar a su doctor(a) al nmero que aparece a continuacin.    Por favor, tenga en cuenta que aunque hacemos todo lo posible para estar disponibles para asuntos urgentes fuera del horario de oficina, no estamos disponibles las 24 horas del da, los 7 das de la semana.   Si tiene un problema urgente y no puede comunicarse con nosotros, puede optar por buscar atencin mdica  en el consultorio de su doctor(a), en una clnica privada, en un centro de atencin urgente o en una sala de emergencias.  Si tiene una emergencia mdica, por favor llame inmediatamente al 911 o vaya a la sala de emergencias.  Nmeros de bper  - Dr. Kowalski: [phone]  - Dra. Moye: [phone]  - Dra. Stewart: [phone]  En caso de inclemencias del tiempo, por favor llame a nuestra lnea principal al [phone] para una actualizacin sobre el estado de cualquier retraso o cierre.  Consejos para la medicacin en dermatologa: Por favor, guarde las cajas en las que vienen los medicamentos de uso tpico para ayudarle a seguir las instrucciones sobre dnde y cmo usarlos. Las farmacias generalmente imprimen las instrucciones del medicamento slo en las cajas y no directamente en los tubos del medicamento.   Si su medicamento es muy caro, por favor, pngase en contacto con nuestra oficina llamando al [phone] y presione la opcin 4 o envenos un mensaje a travs de MyChart.   No podemos decirle cul ser su copago por los medicamentos por adelantado ya que esto es diferente dependiendo de la cobertura de su seguro. Sin embargo, es posible que podamos encontrar un medicamento sustituto a menor costo o llenar un formulario para que el seguro cubra el medicamento que se considera necesario.   Si se requiere una autorizacin previa para que su compaa de seguros cubra su medicamento, por favor permtanos de 1 a 2 das hbiles para completar este proceso.  Los precios de los medicamentos varan con frecuencia dependiendo del lugar de dnde se surte la receta y alguna  farmacias pueden ofrecer precios ms baratos.  El sitio web www.goodrx.com tiene cupones para medicamentos de diferentes farmacias. Los precios aqu no tienen en cuenta lo que podra costar con la ayuda del seguro (puede ser ms barato con su seguro), pero el sitio web puede darle el precio si no utiliz ningn seguro.  - Puede imprimir el cupn correspondiente y llevarlo con su receta a la farmacia.  - Tambin puede   pasar por nuestra oficina durante el horario de atencin regular y recoger una tarjeta de cupones de GoodRx.  - Si necesita que su receta se enve electrnicamente a una farmacia diferente, informe a nuestra oficina a travs de MyChart de [facility] o por telfono llamando al [phone] y presione la opcin 4.  

## 2022-08-14 ENCOUNTER — Telehealth: Payer: Self-pay

## 2022-08-14 NOTE — Telephone Encounter (Signed)
-----   Message from Ralene Bathe, MD sent at 08/14/2022  2:20 PM EST ----- Diagnosis Skin , left tip of nose BASAL CELL CARCINOMA, INFILTRATIVE PATTERN  Cancer - BCC - infiltrative Schedule for MOHS vs Radiation (we discussed briefly on last visit)

## 2022-08-14 NOTE — Telephone Encounter (Signed)
LM on VM please return my call  

## 2022-08-15 ENCOUNTER — Other Ambulatory Visit: Payer: Self-pay

## 2022-08-15 ENCOUNTER — Telehealth: Payer: Self-pay

## 2022-08-15 DIAGNOSIS — C44311 Basal cell carcinoma of skin of nose: Secondary | ICD-10-CM

## 2022-08-15 NOTE — Telephone Encounter (Signed)
-----   Message from Ralene Bathe, MD sent at 08/14/2022  2:20 PM EST ----- Diagnosis Skin , left tip of nose BASAL CELL CARCINOMA, INFILTRATIVE PATTERN  Cancer - BCC - infiltrative Schedule for MOHS vs Radiation (we discussed briefly on last visit)

## 2022-08-15 NOTE — Telephone Encounter (Signed)
Discussed pathology with patient and patient's wife. Both prefer Mohs with Dr. Lacinda Axon to treat [facility] at left tip of nose. Referral sent, Lurlean Horns., RMA

## 2022-08-17 ENCOUNTER — Encounter: Payer: Self-pay | Admitting: Dermatology

## 2022-09-09 ENCOUNTER — Telehealth: Payer: Self-pay

## 2022-09-09 NOTE — Telephone Encounter (Signed)
Updated specimen tracking and history from MOHs progress notes. aw 

## 2022-10-07 ENCOUNTER — Ambulatory Visit (INDEPENDENT_AMBULATORY_CARE_PROVIDER_SITE_OTHER): Payer: Medicare Other | Admitting: Dermatology

## 2022-10-07 ENCOUNTER — Encounter: Payer: Self-pay | Admitting: Dermatology

## 2022-10-07 VITALS — BP 125/64 | HR 73

## 2022-10-07 DIAGNOSIS — L578 Other skin changes due to chronic exposure to nonionizing radiation: Secondary | ICD-10-CM | POA: Diagnosis not present

## 2022-10-07 DIAGNOSIS — Z7189 Other specified counseling: Secondary | ICD-10-CM

## 2022-10-07 DIAGNOSIS — Z79899 Other long term (current) drug therapy: Secondary | ICD-10-CM | POA: Diagnosis not present

## 2022-10-07 DIAGNOSIS — Z5111 Encounter for antineoplastic chemotherapy: Secondary | ICD-10-CM | POA: Diagnosis not present

## 2022-10-07 DIAGNOSIS — L570 Actinic keratosis: Secondary | ICD-10-CM | POA: Diagnosis not present

## 2022-10-07 DIAGNOSIS — Z85828 Personal history of other malignant neoplasm of skin: Secondary | ICD-10-CM | POA: Diagnosis not present

## 2022-10-07 DIAGNOSIS — L905 Scar conditions and fibrosis of skin: Secondary | ICD-10-CM

## 2022-10-07 NOTE — Progress Notes (Unsigned)
Follow-Up Visit   Subjective  Jeff Rhodes. is a 72 y.o. male who presents for the following: Actinic Keratosis (2 month follow up. Tx with LN2 at last visit). The patient has spots, moles and lesions to be evaluated, some may be new or changing and the patient has concerns that these could be cancer.  The following portions of the chart were reviewed this encounter and updated as appropriate:  Tobacco  Allergies  Meds  Problems  Med Hx  Surg Hx  Fam Hx     Review of Systems: No other skin or systemic complaints except as noted in HPI or Assessment and Plan.  Objective  Well appearing patient in no apparent distress; mood and affect are within normal limits.  A focused examination was performed including head, including the scalp, face, neck, nose, ears, eyelids, and lips. Relevant physical exam findings are noted in the Assessment and Plan.  Scalp x16, face x2 (18) Erythematous thin papules/macules with gritty scale.   Right Postauricular Area Dyspigmented smooth macule or patch.    Assessment & Plan   History of Basal Cell Carcinoma of the Skin. Left tip of nose. Mohs 08/29/22 - No evidence of recurrence today - Recommend regular full body skin exams - Recommend daily broad spectrum sunscreen SPF 30+ to sun-exposed areas, reapply every 2 hours as needed.  - Call if any new or changing lesions are noted between office visits   Actinic Damage with PreCancerous Actinic Keratoses Counseling for Topical Chemotherapy Management: Patient exhibits: - Severe, confluent actinic changes with pre-cancerous actinic keratoses that is secondary to cumulative UV radiation exposure over time - Condition that is severe; chronic, not at goal. - diffuse scaly erythematous macules and papules with underlying dyspigmentation - Discussed Prescription "Field Treatment" topical Chemotherapy for Severe, Chronic Confluent Actinic Changes with Pre-Cancerous Actinic Keratoses Field  treatment involves treatment of an entire area of skin that has confluent Actinic Changes (Sun/ Ultraviolet light damage) and PreCancerous Actinic Keratoses by method of PhotoDynamic Therapy (PDT) and/or prescription Topical Chemotherapy agents such as 5-fluorouracil, 5-fluorouracil/calcipotriene, and/or imiquimod.  The purpose is to decrease the number of clinically evident and subclinical PreCancerous lesions to prevent progression to development of skin cancer by chemically destroying early precancer changes that may or may not be visible.  It has been shown to reduce the risk of developing skin cancer in the treated area. As a result of treatment, redness, scaling, crusting, and open sores may occur during treatment course. One or more than one of these methods may be used and may have to be used several times to control, suppress and eliminate the PreCancerous changes. Discussed treatment course, expected reaction, and possible side effects. - Recommend daily broad spectrum sunscreen SPF 30+ to sun-exposed areas, reapply every 2 hours as needed.  - Staying in the shade or wearing long sleeves, sun glasses (UVA+UVB protection) and wide brim hats (4-inch brim around the entire circumference of the hat) are also recommended. - Call for new or changing lesions. May start field treatment next visit.  AK (actinic keratosis) (18) Scalp x16, face x2 Actinic keratoses are precancerous spots that appear secondary to cumulative UV radiation exposure/sun exposure over time. They are chronic with expected duration over 1 year. A portion of actinic keratoses will progress to squamous cell carcinoma of the skin. It is not possible to reliably predict which spots will progress to skin cancer and so treatment is recommended to prevent development of skin cancer.  Recommend daily broad  spectrum sunscreen SPF 30+ to sun-exposed areas, reapply every 2 hours as needed.  Recommend staying in the shade or wearing long  sleeves, sun glasses (UVA+UVB protection) and wide brim hats (4-inch brim around the entire circumference of the hat). Call for new or changing lesions.  Destruction of lesion - Scalp x16, face x2 Complexity: simple   Destruction method: cryotherapy   Informed consent: discussed and consent obtained   Timeout:  patient name, date of birth, surgical site, and procedure verified Lesion destroyed using liquid nitrogen: Yes   Region frozen until ice ball extended beyond lesion: Yes   Outcome: patient tolerated procedure well with no complications   Post-procedure details: wound care instructions given   Additional details:  Prior to procedure, discussed risks of blister formation, small wound, skin dyspigmentation, or rare scar following cryotherapy. Recommend Vaseline ointment to treated areas while healing.   Scar Right Postauricular Area Benign-appearing.  Observation.  Call clinic for new or changing lesions.  Recommend daily use of broad spectrum spf 30+ sunscreen to sun-exposed areas.   Return for AK Follow Up in 2-3 months.  I, Emelia Salisbury, CMA, am acting as scribe for Sarina Ser, MD. Documentation: I have reviewed the above documentation for accuracy and completeness, and I agree with the above.  Sarina Ser, MD

## 2022-10-07 NOTE — Patient Instructions (Signed)
Cryotherapy Aftercare  Wash gently with soap and water everyday.   Apply Vaseline daily until healed.   Recommend daily broad spectrum sunscreen SPF 30+ to sun-exposed areas, reapply every 2 hours as needed. Call for new or changing lesions.  Staying in the shade or wearing long sleeves, sun glasses (UVA+UVB protection) and wide brim hats (4-inch brim around the entire circumference of the hat) are also recommended for sun protection.     Due to recent changes in healthcare laws, you may see results of your pathology and/or laboratory studies on MyChart before the doctors have had a chance to review them. We understand that in some cases there may be results that are confusing or concerning to you. Please understand that not all results are received at the same time and often the doctors may need to interpret multiple results in order to provide you with the best plan of care or course of treatment. Therefore, we ask that you please give [place] 2 business days to thoroughly review all your results before contacting the office for clarification. Should we see a critical lab result, you will be contacted sooner.   If You Need Anything After Your Visit  If you have any questions or concerns for your doctor, please call our main line at [phone] and press option 4 to reach your doctor's medical assistant. If no one answers, please leave a voicemail as directed and we will return your call as soon as possible. Messages left after 4 pm will be answered the following business day.   You may also send [place] a message via [place]. We typically respond to MyChart messages within 1-2 business days.  For prescription refills, please ask your pharmacy to contact our office. Our fax number is [phone].  If you have an urgent issue when the clinic is closed that cannot wait until the next business day, you can page your doctor at the number below.    Please note that while we do our best to be available for  urgent issues outside of office hours, we are not available 24/7.   If you have an urgent issue and are unable to reach [place], you may choose to seek medical care at your doctor's office, retail clinic, urgent care center, or emergency room.  If you have a medical emergency, please immediately call 911 or go to the emergency department.  Pager Numbers  - Dr. Nehemiah Massed: [phone]  - Dr. Laurence Ferrari: [phone]  - Dr. Nicole Kindred: [phone]  In the event of inclement weather, please call our main line at [phone] for an update on the status of any delays or closures.  Dermatology Medication Tips: Please keep the boxes that topical medications come in in order to help keep track of the instructions about where and how to use these. Pharmacies typically print the medication instructions only on the boxes and not directly on the medication tubes.   If your medication is too expensive, please contact our office at [phone] option 4 or send [place] a message through [place].   We are unable to tell what your co-pay for medications will be in advance as this is different depending on your insurance coverage. However, we may be able to find a substitute medication at lower cost or fill out paperwork to get insurance to cover a needed medication.   If a prior authorization is required to get your medication covered by your insurance company, please allow [place] 1-2 business days to complete this process.  Drug prices  often vary depending on where the prescription is filled and some pharmacies may offer cheaper prices.  The website www.goodrx.com contains coupons for medications through different pharmacies. The prices here do not account for what the cost may be with help from insurance (it may be cheaper with your insurance), but the website can give you the price if you did not use any insurance.  - You can print the associated coupon and take it with your prescription to the pharmacy.  - You may also  stop by our office during regular business hours and pick up a GoodRx coupon card.  - If you need your prescription sent electronically to a different pharmacy, notify our office through [facility] or by phone at [phone] option 4.     Si Usted Necesita Algo Despus de Su Visita  Tambin puede enviarnos un mensaje a travs de Pharmacist, community. Por lo general respondemos a los mensajes de MyChart en el transcurso de 1 a 2 das hbiles.  Para renovar recetas, por favor pida a su farmacia que se ponga en contacto con nuestra oficina. Harland Dingwall de fax es [place] [phone].  Si tiene un asunto urgente cuando la clnica est cerrada y que no puede esperar hasta el siguiente da hbil, puede llamar/localizar a su doctor(a) al nmero que aparece a continuacin.   Por favor, tenga en cuenta que aunque hacemos todo lo posible para estar disponibles para asuntos urgentes fuera del horario de [place], no estamos disponibles las 24 horas del da, los 7 das de la [place].   Si tiene un problema urgente y no puede comunicarse con nosotros, puede optar por buscar atencin mdica  en el consultorio de su doctor(a), en una clnica privada, en un centro de atencin urgente o en una sala de emergencias.  Si tiene Engineering geologist, por favor llame inmediatamente al 911 o vaya a la sala de emergencias.  Nmeros de bper  - Dr. Nehemiah Massed: [phone]  - Dra. Moye: [phone]  - Dra. Nicole Kindred: [phone]  En caso de inclemencias del [place], por favor llame a Johnsie Kindred principal al [phone] para una actualizacin sobre el [place] de cualquier retraso o cierre.  Consejos para la medicacin en dermatologa: Por favor, guarde las cajas en las que vienen los medicamentos de uso tpico para ayudarle a seguir las instrucciones sobre dnde y cmo usarlos. Las farmacias generalmente imprimen las instrucciones del medicamento slo en las cajas y no directamente en los tubos del [place].   Si  su medicamento es muy caro, por favor, pngase en contacto con Zigmund Daniel llamando al [phone] y presione la opcin 4 o envenos un mensaje a travs de Pharmacist, community.   No podemos decirle cul ser su copago por los medicamentos por adelantado ya que esto es diferente dependiendo de la cobertura de su seguro. Sin embargo, es posible que podamos encontrar un medicamento sustituto a Electrical engineer un formulario para que el seguro cubra el medicamento que se considera necesario.   Si se requiere una autorizacin previa para que su compaa de seguros [place] su medicamento, por favor permtanos de 1 a 2 das hbiles para completar este proceso.  Los precios de los medicamentos varan con frecuencia dependiendo del Environmental consultant de dnde se surte la receta y alguna farmacias pueden ofrecer precios ms baratos.  El sitio web www.goodrx.com tiene cupones para medicamentos de Airline pilot. Los precios aqu no tienen en cuenta lo que podra costar con la ayuda del seguro (puede ser ms barato con  su seguro), pero el sitio web puede darle el precio si no Field seismologist.  - Puede imprimir el cupn correspondiente y llevarlo con su receta a la farmacia.  - Tambin puede pasar por nuestra oficina durante el horario de atencin regular y Charity fundraiser una tarjeta de cupones de GoodRx.  - Si necesita que su receta se enve electrnicamente a una farmacia diferente, informe a nuestra oficina a travs de MyChart de [place] o por telfono llamando al [phone] y presione la opcin 4.

## 2022-10-08 ENCOUNTER — Encounter: Payer: Self-pay | Admitting: Dermatology

## 2022-12-25 ENCOUNTER — Ambulatory Visit (INDEPENDENT_AMBULATORY_CARE_PROVIDER_SITE_OTHER): Payer: Medicare Other | Admitting: Dermatology

## 2022-12-25 VITALS — BP 136/85

## 2022-12-25 DIAGNOSIS — Z79899 Other long term (current) drug therapy: Secondary | ICD-10-CM | POA: Diagnosis not present

## 2022-12-25 DIAGNOSIS — Z7189 Other specified counseling: Secondary | ICD-10-CM | POA: Diagnosis not present

## 2022-12-25 DIAGNOSIS — C44319 Basal cell carcinoma of skin of other parts of face: Secondary | ICD-10-CM

## 2022-12-25 DIAGNOSIS — D692 Other nonthrombocytopenic purpura: Secondary | ICD-10-CM | POA: Diagnosis not present

## 2022-12-25 DIAGNOSIS — L578 Other skin changes due to chronic exposure to nonionizing radiation: Secondary | ICD-10-CM

## 2022-12-25 DIAGNOSIS — Z5111 Encounter for antineoplastic chemotherapy: Secondary | ICD-10-CM

## 2022-12-25 DIAGNOSIS — L820 Inflamed seborrheic keratosis: Secondary | ICD-10-CM | POA: Diagnosis not present

## 2022-12-25 DIAGNOSIS — D485 Neoplasm of uncertain behavior of skin: Secondary | ICD-10-CM

## 2022-12-25 DIAGNOSIS — L570 Actinic keratosis: Secondary | ICD-10-CM | POA: Diagnosis not present

## 2022-12-25 DIAGNOSIS — C44310 Basal cell carcinoma of skin of unspecified parts of face: Secondary | ICD-10-CM

## 2022-12-25 DIAGNOSIS — Z85828 Personal history of other malignant neoplasm of skin: Secondary | ICD-10-CM

## 2022-12-25 NOTE — Patient Instructions (Addendum)
Instructions for [facility] Medications  One or more of your medications was sent to the [facility] mail order compounding pharmacy. You will receive an email from them and can purchase the medicine through that link. It will then be mailed to your home at the address you confirmed. If for any reason you do not receive an email from them, please check your spam folder. If you still do not find the email, please let us know. [facility] phone number is [phone].   Cryotherapy Aftercare  Wash gently with soap and water everyday.   Apply Vaseline and Band-Aid daily until healed.    Shave Excision Benign Lesion Wound Care Instructions  Leave the original bandage on for 24 hours if possible.  If the bandage becomes soaked or soiled before that time, it is OK to remove it and examine the wound.  A small amount of post-operative bleeding is normal.  If excessive bleeding occurs, remove the bandage, place gauze over the site and apply continuous pressure (no peeking) over the area for 20-30 minutes.  If this does not stop the bleeding, try again for 40 minutes.  If this does not work, please call our clinic as soon as possible (even if after-hours).    Twice a day, cleanse the wound with soap and water.  If a thick crust develops you may use a Q-tip dipped into dilute hydrogen peroxide (mix 1:1 with water) to dissolve it.  Hydrogen peroxide can slow the healing process, so use it only as needed.  After washing, apply Vaseline jelly or Polysporin ointment.  For best healing, the wound should be covered with a layer of ointment at all times.  This may mean re-applying the ointment several times a day.  For open wounds, continue until it has healed.    If you have any swelling, keep the area elevated.  Some redness, tenderness and white or yellow material in the wound is normal healing.  If the area becomes very sore and red, or develops a thick yellow-green material (pus), it may be  infected; please notify us.    Wound healing continues for up to one year following surgery.  It is not unusual to experience pain in the scar from time to time during the interval.  If the pain becomes severe or the scar thickens, you should notify the office.  A slight amount of redness in a scar is expected for the first six months.  After six months, the redness subsides and the scar will soften and fade.  The color difference becomes less noticeable with time.  If there are any problems, return for a post-op surgery check at your earliest convenience.  Please call our office for any questions or concerns.    Due to recent changes in healthcare laws, you may see results of your pathology and/or laboratory studies on MyChart before the doctors have had a chance to review them. We understand that in some cases there may be results that are confusing or concerning to you. Please understand that not all results are received at the same time and often the doctors may need to interpret multiple results in order to provide you with the best plan of care or course of treatment. Therefore, we ask that you please give us 2 business days to thoroughly review all your results before contacting the office for clarification. Should we see a critical lab result, you will be contacted sooner.   If You Need Anything After Your Visit    If you have any questions or concerns for your doctor, please call our main line at [phone] and press option 4 to reach your doctor's medical assistant. If no one answers, please leave a voicemail as directed and we will return your call as soon as possible. Messages left after 4 pm will be answered the following business day.   You may also send us a message via MyChart. We typically respond to MyChart messages within 1-2 business days.  For prescription refills, please ask your pharmacy to contact our office. Our fax number is [phone].  If you have an urgent issue when  the clinic is closed that cannot wait until the next business day, you can page your doctor at the number below.    Please note that while we do our best to be available for urgent issues outside of office hours, we are not available 24/7.   If you have an urgent issue and are unable to reach us, you may choose to seek medical care at your doctor's office, retail clinic, urgent care center, or emergency room.  If you have a medical emergency, please immediately call 911 or go to the emergency department.  Pager Numbers  - Dr. Kowalski: [phone]  - Dr. Moye: [phone]  - Dr. Stewart: [phone]  In the event of inclement weather, please call our main line at [phone] for an update on the status of any delays or closures.  Dermatology Medication Tips: Please keep the boxes that topical medications come in in order to help keep track of the instructions about where and how to use these. Pharmacies typically print the medication instructions only on the boxes and not directly on the medication tubes.   If your medication is too expensive, please contact our office at [phone] option 4 or send us a message through MyChart.   We are unable to tell what your co-pay for medications will be in advance as this is different depending on your insurance coverage. However, we may be able to find a substitute medication at lower cost or fill out paperwork to get insurance to cover a needed medication.   If a prior authorization is required to get your medication covered by your insurance company, please allow us 1-2 business days to complete this process.  Drug prices often vary depending on where the prescription is filled and some pharmacies may offer cheaper prices.  The website www.goodrx.com contains coupons for medications through different pharmacies. The prices here do not account for what the cost may be with help from insurance (it may be cheaper with your insurance), but the  website can give you the price if you did not use any insurance.  - You can print the associated coupon and take it with your prescription to the pharmacy.  - You may also stop by our office during regular business hours and pick up a GoodRx coupon card.  - If you need your prescription sent electronically to a different pharmacy, notify our office through [place] MyChart or by phone at [phone] option 4.     Si Usted Necesita Algo Despus de Su Visita  Tambin puede enviarnos un mensaje a travs de MyChart. Por lo general respondemos a los mensajes de MyChart en el transcurso de 1 a 2 das hbiles.  Para renovar recetas, por favor pida a su farmacia que se ponga en contacto con nuestra oficina. Nuestro nmero de fax es el [phone].  Si tiene un asunto urgente cuando la clnica est   cerrada y que no puede esperar hasta el siguiente da hbil, puede llamar/localizar a su doctor(a) al nmero que aparece a continuacin.   Por favor, tenga en cuenta que aunque hacemos todo lo posible para estar disponibles para asuntos urgentes fuera del horario de oficina, no estamos disponibles las 24 horas del da, los 7 das de la semana.   Si tiene un problema urgente y no puede comunicarse con nosotros, puede optar por buscar atencin mdica  en el consultorio de su doctor(a), en una clnica privada, en un centro de atencin urgente o en una sala de emergencias.  Si tiene una emergencia mdica, por favor llame inmediatamente al 911 o vaya a la sala de emergencias.  Nmeros de bper  - Dr. Kowalski: [phone]  - Dra. Moye: [phone]  - Dra. Stewart: [phone]  En caso de inclemencias del tiempo, por favor llame a nuestra lnea principal al [phone] para una actualizacin sobre el estado de cualquier retraso o cierre.  Consejos para la medicacin en dermatologa: Por favor, guarde las cajas en las que vienen los medicamentos de uso tpico para ayudarle a seguir las  instrucciones sobre dnde y cmo usarlos. Las farmacias generalmente imprimen las instrucciones del medicamento slo en las cajas y no directamente en los tubos del medicamento.   Si su medicamento es muy caro, por favor, pngase en contacto con nuestra oficina llamando al [phone] y presione la opcin 4 o envenos un mensaje a travs de MyChart.   No podemos decirle cul ser su copago por los medicamentos por adelantado ya que esto es diferente dependiendo de la cobertura de su seguro. Sin embargo, es posible que podamos encontrar un medicamento sustituto a menor costo o llenar un formulario para que el seguro cubra el medicamento que se considera necesario.   Si se requiere una autorizacin previa para que su compaa de seguros cubra su medicamento, por favor permtanos de 1 a 2 das hbiles para completar este proceso.  Los precios de los medicamentos varan con frecuencia dependiendo del lugar de dnde se surte la receta y alguna farmacias pueden ofrecer precios ms baratos.  El sitio web www.goodrx.com tiene cupones para medicamentos de diferentes farmacias. Los precios aqu no tienen en cuenta lo que podra costar con la ayuda del seguro (puede ser ms barato con su seguro), pero el sitio web puede darle el precio si no utiliz ningn seguro.  - Puede imprimir el cupn correspondiente y llevarlo con su receta a la farmacia.  - Tambin puede pasar por nuestra oficina durante el horario de atencin regular y recoger una tarjeta de cupones de GoodRx.  - Si necesita que su receta se enve electrnicamente a una farmacia diferente, informe a nuestra oficina a travs de MyChart de [place] o por telfono llamando al [phone] y presione la opcin 4.  

## 2022-12-25 NOTE — Progress Notes (Signed)
Follow-Up Visit   Subjective  Jeff Rhodes. is a 72 y.o. male who presents for the following: AK follow up of scalp and face treated with LN2 - patient moved to [facility] and would like a referral to [facility] as it is closer to his home The patient has spots, moles and lesions to be evaluated, some may be new or changing and the patient may have concern these could be cancer.  The following portions of the chart were reviewed this encounter and updated as appropriate: medications, allergies, medical history  Review of Systems:  No other skin or systemic complaints except as noted in HPI or Assessment and Plan.  Objective  Well appearing patient in no apparent distress; mood and affect are within normal limits. A focused examination was performed of the following areas: Scalp, face Relevant exam findings are noted in the Assessment and Plan.  Scalp x 1, right hand x 1 (2) Erythematous stuck-on, waxy papule or plaque  Scalp, face, ears (10) Erythematous thin papules/macules with gritty scale.   Right middle medial forehead Flesh colored papule 0.7 x 0.5 cm      Assessment & Plan   Purpura - Chronic; persistent and recurrent.  Treatable, but not curable. - Violaceous macules and patches - Benign - Related to trauma, age, sun damage and/or use of blood thinners, chronic use of topical and/or oral steroids - Observe - Can use OTC arnica containing moisturizer such as Dermend Bruise Formula if desired - Call for worsening or other concerns  ACTINIC DAMAGE - chronic, secondary to cumulative UV radiation exposure/sun exposure over time - diffuse scaly erythematous macules with underlying dyspigmentation - Recommend daily broad spectrum sunscreen SPF 30+ to sun-exposed areas, reapply every 2 hours as needed.  - Recommend staying in the shade or wearing long sleeves, sun glasses (UVA+UVB protection) and wide brim hats (4-inch brim around the entire circumference of  the hat). - Call for new or changing lesions.  Inflamed seborrheic keratosis (2) Scalp x 1, right hand x 1  Destruction of lesion - Scalp x 1, right hand x 1 Complexity: simple   Destruction method: cryotherapy   Informed consent: discussed and consent obtained   Timeout:  patient name, date of birth, surgical site, and procedure verified Lesion destroyed using liquid nitrogen: Yes   Region frozen until ice ball extended beyond lesion: Yes   Outcome: patient tolerated procedure well with no complications   Post-procedure details: wound care instructions given    AK (actinic keratosis) (10) Scalp, face, ears  ACTINIC DAMAGE WITH PRECANCEROUS ACTINIC KERATOSES Counseling for Topical Chemotherapy Management: Patient exhibits: - Severe, confluent actinic changes with pre-cancerous actinic keratoses that is secondary to cumulative UV radiation exposure over time - Condition that is severe; chronic, not at goal. - diffuse scaly erythematous macules and papules with underlying dyspigmentation - Discussed Prescription "Field Treatment" topical Chemotherapy for Severe, Chronic Confluent Actinic Changes with Pre-Cancerous Actinic Keratoses Field treatment involves treatment of an entire area of skin that has confluent Actinic Changes (Sun/ Ultraviolet light damage) and PreCancerous Actinic Keratoses by method of PhotoDynamic Therapy (PDT) and/or prescription Topical Chemotherapy agents such as 5-fluorouracil, 5-fluorouracil/calcipotriene, and/or imiquimod.  The purpose is to decrease the number of clinically evident and subclinical PreCancerous lesions to prevent progression to development of skin cancer by chemically destroying early precancer changes that may or may not be visible.  It has been shown to reduce the risk of developing skin cancer in the treated area. As a  result of treatment, redness, scaling, crusting, and open sores may occur during treatment course. One or more than one of these  methods may be used and may have to be used several times to control, suppress and eliminate the PreCancerous changes. Discussed treatment course, expected reaction, and possible side effects. - Recommend daily broad spectrum sunscreen SPF 30+ to sun-exposed areas, reapply every 2 hours as needed.  - Staying in the shade or wearing long sleeves, sun glasses (UVA+UVB protection) and wide brim hats (4-inch brim around the entire circumference of the hat) are also recommended. - Call for new or changing lesions. - Start 5-fluorouracil/calcipotriene cream twice a day for 7 days to affected areas including scalp and forehead. Prescription sent to [facility]. Patient advised they will receive an email to purchase the medication online and have it sent to their home. Patient provided with handout reviewing treatment course and side effects and advised to call or message [place] on MyChart with any concerns.   Destruction of lesion - Scalp, face, ears Complexity: simple   Destruction method: cryotherapy   Informed consent: discussed and consent obtained   Timeout:  patient name, date of birth, surgical site, and procedure verified Lesion destroyed using liquid nitrogen: Yes   Region frozen until ice ball extended beyond lesion: Yes   Outcome: patient tolerated procedure well with no complications   Post-procedure details: wound care instructions given    Related Procedures Ambulatory referral to Dermatology  Neoplasm of uncertain behavior of skin Right middle medial forehead  Epidermal / dermal shaving  Lesion diameter (cm):  0.7 Informed consent: discussed and consent obtained   Timeout: patient name, date of birth, surgical site, and procedure verified   Procedure prep:  Patient was prepped and draped in usual sterile fashion Prep type:  Isopropyl alcohol Anesthesia: the lesion was anesthetized in a standard fashion   Anesthetic:  1% lidocaine w/ epinephrine 1-100,000  buffered w/ 8.4% NaHCO3 Instrument used: flexible razor blade   Hemostasis achieved with: pressure, aluminum chloride and electrodesiccation   Outcome: patient tolerated procedure well   Post-procedure details: sterile dressing applied and wound care instructions given   Dressing type: bandage and petrolatum    Destruction of lesion Complexity: extensive   Destruction method: electrodesiccation and curettage   Informed consent: discussed and consent obtained   Timeout:  patient name, date of birth, surgical site, and procedure verified Procedure prep:  Patient was prepped and draped in usual sterile fashion Prep type:  Isopropyl alcohol Anesthesia: the lesion was anesthetized in a standard fashion   Anesthetic:  1% lidocaine w/ epinephrine 1-100,000 buffered w/ 8.4% NaHCO3 Curettage performed in three different directions: Yes   Electrodesiccation performed over the curetted area: Yes   Lesion length (cm):  0.7 Lesion width (cm):  0.5 Margin per side (cm):  0.2 Final wound size (cm):  1.1 Hemostasis achieved with:  pressure and aluminum chloride Outcome: patient tolerated procedure well with no complications   Post-procedure details: sterile dressing applied and wound care instructions given   Dressing type: bandage and petrolatum    Specimen 1 - Surgical pathology Differential Diagnosis: R/O BCC vs other  Check Margins: No  HISTORY OF BASAL CELL CARCINOMA OF THE SKIN - No evidence of recurrence today - Recommend regular full body skin exams - Recommend daily broad spectrum sunscreen SPF 30+ to sun-exposed areas, reapply every 2 hours as needed.  - Call if any new or changing lesions are noted between office visits  Return  in about 6 months (around 06/26/2023).  I, Joanie Coddington, CMA, am acting as scribe for Armida Sans, MD .  Documentation: I have reviewed the above documentation for accuracy and completeness, and I agree with the above.  Armida Sans, MD

## 2022-12-30 ENCOUNTER — Telehealth: Payer: Self-pay

## 2022-12-30 NOTE — Telephone Encounter (Signed)
Advised pt of bx results/sh ?

## 2022-12-30 NOTE — Telephone Encounter (Signed)
-----   Message from Deirdre Evener, MD sent at 12/29/2022  2:19 PM EDT ----- Diagnosis Skin , right middle medial forehead BASAL CELL CARCINOMA, NODULAR PATTERN  Cancer= BCC Already treated Recheck next visit

## 2022-12-31 ENCOUNTER — Encounter: Payer: Self-pay | Admitting: Dermatology

## 2023-07-01 ENCOUNTER — Ambulatory Visit: Payer: Medicare Other | Admitting: Dermatology

## 2023-07-01 DIAGNOSIS — B079 Viral wart, unspecified: Secondary | ICD-10-CM | POA: Diagnosis not present

## 2023-07-01 DIAGNOSIS — L570 Actinic keratosis: Secondary | ICD-10-CM

## 2023-07-01 DIAGNOSIS — W908XXA Exposure to other nonionizing radiation, initial encounter: Secondary | ICD-10-CM

## 2023-07-01 DIAGNOSIS — Z7189 Other specified counseling: Secondary | ICD-10-CM

## 2023-07-01 DIAGNOSIS — L814 Other melanin hyperpigmentation: Secondary | ICD-10-CM | POA: Diagnosis not present

## 2023-07-01 DIAGNOSIS — D229 Melanocytic nevi, unspecified: Secondary | ICD-10-CM

## 2023-07-01 DIAGNOSIS — L729 Follicular cyst of the skin and subcutaneous tissue, unspecified: Secondary | ICD-10-CM

## 2023-07-01 DIAGNOSIS — L821 Other seborrheic keratosis: Secondary | ICD-10-CM | POA: Diagnosis not present

## 2023-07-01 DIAGNOSIS — L578 Other skin changes due to chronic exposure to nonionizing radiation: Secondary | ICD-10-CM | POA: Diagnosis not present

## 2023-07-01 DIAGNOSIS — Z5111 Encounter for antineoplastic chemotherapy: Secondary | ICD-10-CM

## 2023-07-01 DIAGNOSIS — Z79899 Other long term (current) drug therapy: Secondary | ICD-10-CM

## 2023-07-01 DIAGNOSIS — L720 Epidermal cyst: Secondary | ICD-10-CM

## 2023-07-01 DIAGNOSIS — L820 Inflamed seborrheic keratosis: Secondary | ICD-10-CM

## 2023-07-01 NOTE — Patient Instructions (Addendum)
Instructions for [facility]  One or more of your medications was sent to the [facility]. You will receive an email from them and can purchase the medicine through that link. It will then be mailed to your home at the address you confirmed. If for any reason you do not receive an email from them, please check your spam folder. If you still do not find the email, please let us know. [facility] phone number is [phone].  5-Fluorouracil/Calcipotriene Patient Education   Actinic keratoses are the dry, red scaly spots on the skin caused by sun damage. A portion of these spots can turn into skin cancer with time, and treating them can help prevent development of skin cancer.   Treatment of these spots requires removal of the defective skin cells. There are various ways to remove actinic keratoses, including freezing with liquid nitrogen, treatment with creams, or treatment with a blue light procedure in the office.   5-fluorouracil cream is a topical cream used to treat actinic keratoses. It works by interfering with the growth of abnormal fast-growing skin cells, such as actinic keratoses. These cells peel off and are replaced by healthy ones. THIS CREAM SHOULD BE KEPT OUT OF REACH OF CHILDREN AND PETS AND SHOULD NOT BE USED BY PREGNANT WOMEN.  5-fluorouracil/calcipotriene is a combination of the 5-fluorouracil cream with a vitamin D analog cream called calcipotriene. The calcipotriene alone does not treat actinic keratoses. However, when it is combined with 5-fluorouracil, it helps the 5-fluorouracil treat the actinic keratoses much faster so that the same results can be achieved with a much shorter treatment time.  INSTRUCTIONS FOR 5-FLUOROURACIL/CALCIPOTRIENE CREAM:   5-fluorouracil/calcipotriene cream typically only needs to be used for 4-7 days. A thin layer should be applied twice a day to the treatment areas recommended by your  physician.   If your physician prescribed you separate tubes of 5-fluourouracil and calcipotriene, apply a thin layer of 5-fluorouracil followed by a thin layer of calcipotriene.   Avoid contact with your eyes or nostrils. Avoid applying the cream to your eyelids or lips unless directed to apply there by your physician. Do not use 5-fluorouracil/calcipotriene cream on infected or open wounds.   You will develop redness, irritation and some crusting at areas where you have pre-cancer damage/actinic keratoses. IF YOU DEVELOP PAIN, BLEEDING, OR SIGNIFICANT CRUSTING, STOP THE TREATMENT EARLY - you have already gotten a good response and the actinic keratoses should clear up well.  Wash your hands after applying 5-fluorouracil 5% cream on your skin.   A moisturizer or sunscreen with a minimum SPF 30 should be applied each morning.   Once you have finished the treatment, you can apply a thin layer of Vaseline twice a day to irritated areas to soothe and calm the areas more quickly. If you experience significant discomfort, contact your physician.  For some patients it is necessary to repeat the treatment for best results.  SIDE EFFECTS: When using 5-fluorouracil/calcipotriene cream, you may have mild irritation, such as redness, dryness, swelling, or a mild burning sensation. This usually resolves within 2 weeks. The more actinic keratoses you have, the more redness and inflammation you can expect during treatment. Eye irritation has been reported rarely. If this occurs, please let us know.   If you have any trouble using this cream, please send [place] a MyChart message or call the office. If you have any other questions about this information, please do not hesitate to ask me before  you leave the office or contact me on MyChart or by phone.    Due to recent changes in healthcare laws, you may see results of your pathology and/or laboratory studies on MyChart before the doctors have had a chance to  review them. We understand that in some cases there may be results that are confusing or concerning to you. Please understand that not all results are received at the same time and often the doctors may need to interpret multiple results in order to provide you with the best plan of care or course of treatment. Therefore, we ask that you please give [place] 2 business days to thoroughly review all your results before contacting the office for clarification. Should we see a critical lab result, you will be contacted sooner.   If You Need Anything After Your Visit  If you have any questions or concerns for your doctor, please call our main line at [phone] and press option 4 to reach your doctor's medical assistant. If no one answers, please leave a voicemail as directed and we will return your call as soon as possible. Messages left after 4 pm will be answered the following business day.   You may also send [place] a message via MyChart. We typically respond to MyChart messages within 1-2 business days.  For prescription refills, please ask your pharmacy to contact our office. Our fax number is [phone].  If you have an urgent issue when the clinic is closed that cannot wait until the next business day, you can page your doctor at the number below.    Please note that while we do our best to be available for urgent issues outside of office hours, we are not available 24/7.   If you have an urgent issue and are unable to reach [place], you may choose to seek medical care at your doctor's office, retail clinic, urgent care center, or emergency room.  If you have a medical emergency, please immediately call 911 or go to the emergency department.  Pager Numbers  - Dr. Gwen Pounds: [phone]  - Dr. Roseanne Reno: [phone]  - Dr. Katrinka Blazing: [phone]   In the event of inclement weather, please call our main line at [phone] for an update on the status of any delays or closures.  Dermatology  Medication Tips: Please keep the boxes that topical medications come in in order to help keep track of the instructions about where and how to use these. Pharmacies typically print the medication instructions only on the boxes and not directly on the medication tubes.   If your medication is too expensive, please contact our office at [phone] option 4 or send [place] a message through MyChart.   We are unable to tell what your co-pay for medications will be in advance as this is different depending on your insurance coverage. However, we may be able to find a substitute medication at lower cost or fill out paperwork to get insurance to cover a needed medication.   If a prior authorization is required to get your medication covered by your insurance company, please allow [place] 1-2 business days to complete this process.  Drug prices often vary depending on where the prescription is filled and some pharmacies may offer cheaper prices.  The website www.goodrx.com contains coupons for medications through different pharmacies. The prices here do not account for what the cost may be with help from insurance (it may be cheaper with your insurance), but the website can give you the price if you  did not use any insurance.  - You can print the associated coupon and take it with your prescription to the pharmacy.  - You may also stop by our office during regular business hours and pick up a GoodRx coupon card.  - If you need your prescription sent electronically to a different pharmacy, notify our office through [facility] or by phone at [phone] option 4.     Si Usted Necesita Algo Despus de Su Visita  Tambin puede enviarnos un mensaje a travs de Clinical cytogeneticist. Por lo general respondemos a los mensajes de MyChart en el transcurso de 1 a 2 das hbiles.  Para renovar recetas, por favor pida a su farmacia que se ponga en contacto con nuestra oficina. Annie Sable de fax es [place] [phone].  Si  tiene un asunto urgente cuando la clnica est cerrada y que no puede esperar hasta el siguiente da hbil, puede llamar/localizar a su doctor(a) al nmero que aparece a continuacin.   Por favor, tenga en cuenta que aunque hacemos todo lo posible para estar disponibles para asuntos urgentes fuera del horario de [place], no estamos disponibles las 24 horas del da, los 7 [address]  [address] de la [place].   Si tiene un problema urgente y no puede comunicarse con nosotros, puede optar por buscar atencin mdica  en el consultorio de su doctor(a), en una clnica privada, en un centro de atencin urgente o en una sala de emergencias.  Si tiene Engineer, drilling, por favor llame inmediatamente al 911 o vaya a la sala de emergencias.  Nmeros de bper  - Dr. Gwen Pounds: [phone]  - Dra. Roseanne Reno: [phone]  - Dr. Katrinka Blazing: [phone]   En caso de inclemencias del tiempo, por favor llame a Lacy Duverney principal al [phone] para una actualizacin sobre el [place] de cualquier retraso o cierre.  Consejos para la medicacin en dermatologa: Por favor, guarde las cajas en las que vienen los medicamentos de uso tpico para ayudarle a seguir las instrucciones sobre dnde y cmo usarlos. Las farmacias generalmente imprimen las instrucciones del medicamento slo en las cajas y no directamente en los tubos del [place].   Si su medicamento es muy caro, por favor, pngase en contacto con Rolm [place]ala llamando al [phone] y presione la opcin 4 o envenos un mensaje a travs de Clinical cytogeneticist.   No podemos decirle cul ser su copago por los medicamentos por adelantado ya que esto es diferente dependiendo de la cobertura de su seguro. Sin embargo, es posible que podamos encontrar un medicamento sustituto a Audiological scientist un formulario para que el seguro cubra el medicamento que se considera necesario.   Si se requiere una autorizacin previa para que su compaa de seguros [place] su medicamento, por  favor permtanos de 1 a 2 das hbiles para completar [address]  Los precios de los medicamentos varan con frecuencia dependiendo del Environmental consultant de dnde se surte la receta y alguna farmacias pueden ofrecer precios ms baratos.  El sitio web www.goodrx.com tiene cupones para medicamentos de Health and safety inspector. Los precios aqu no tienen en cuenta lo que podra costar con la ayuda del seguro (puede ser ms barato con su seguro), pero el sitio web puede darle el precio si no utiliz Tourist information centre manager.  - Puede imprimir el cupn correspondiente y llevarlo con su receta a la farmacia.  - Tambin puede pasar por nuestra oficina durante el horario de atencin regular y Education officer, museum una tarjeta de cupones de GoodRx.  - Si necesita que su receta  se enve electrnicamente a una farmacia diferente, informe a nuestra oficina a travs de MyChart de [place] o por telfono llamando al [phone] y presione la opcin 4.

## 2023-07-01 NOTE — Progress Notes (Signed)
Follow-Up Visit   Subjective  Jeff Rhodes. is a 72 y.o. male who presents for the following: AK f/u, patient was unable to use 5FU/Calcipotriene mix. Recheck sun exposed areas today.  Pt transferring care to [facility] since moving about two hours away.   The patient has spots, moles and lesions to be evaluated, some may be new or changing and the patient may have concern these could be cancer.   The following portions of the chart were reviewed this encounter and updated as appropriate: medications, allergies, medical history  Review of Systems:  No other skin or systemic complaints except as noted in HPI or Assessment and Plan.  Objective  Well appearing patient in no apparent distress; mood and affect are within normal limits.  A focused examination was performed of the following areas: the face, scalp, arms, hands, and ears.   Relevant exam findings are noted in the Assessment and Plan.  Scalp face and ears x 17 (17) Erythematous thin papules/macules with gritty scale.   L volar index finger x 1 Verrucous papules -- Discussed viral etiology and contagion.   R post lat neck x 1 Erythematous stuck-on, waxy papule or plaque    Assessment & Plan   ACTINIC DAMAGE WITH PRECANCEROUS ACTINIC KERATOSES Counseling for Topical Chemotherapy Management: Patient exhibits: - Severe, confluent actinic changes with pre-cancerous actinic keratoses that is secondary to cumulative UV radiation exposure over time - Condition that is severe; chronic, not at goal. - diffuse scaly erythematous macules and papules with underlying dyspigmentation - Discussed Prescription "Field Treatment" topical Chemotherapy for Severe, Chronic Confluent Actinic Changes with Pre-Cancerous Actinic Keratoses Field treatment involves treatment of an entire area of skin that has confluent Actinic Changes (Sun/ Ultraviolet light damage) and PreCancerous Actinic Keratoses by method of PhotoDynamic  Therapy (PDT) and/or prescription Topical Chemotherapy agents such as 5-fluorouracil, 5-fluorouracil/calcipotriene, and/or imiquimod.  The purpose is to decrease the number of clinically evident and subclinical PreCancerous lesions to prevent progression to development of skin cancer by chemically destroying early precancer changes that may or may not be visible.  It has been shown to reduce the risk of developing skin cancer in the treated area. As a result of treatment, redness, scaling, crusting, and open sores may occur during treatment course. One or more than one of these methods may be used and may have to be used several times to control, suppress and eliminate the PreCancerous changes. Discussed treatment course, expected reaction, and possible side effects. - Recommend daily broad spectrum sunscreen SPF 30+ to sun-exposed areas, reapply every 2 hours as needed.  - Staying in the shade or wearing long sleeves, sun glasses (UVA+UVB protection) and wide brim hats (4-inch brim around the entire circumference of the hat) are also recommended. - Call for new or changing lesions. - In one month start 5FU/Calcipotriene mix BID x 7 days to the forehead, scalp, and temples.   SEBORRHEIC KERATOSIS - Stuck-on, waxy, tan-brown papules and/or plaques  - Benign-appearing - Discussed benign etiology and prognosis. - Observe - Call for any changes  LENTIGINES Exam: scattered tan macules Due to sun exposure Treatment Plan: Benign-appearing, observe. Recommend daily broad spectrum sunscreen SPF 30+ to sun-exposed areas, reapply every 2 hours as needed.  Call for any changes  MELANOCYTIC NEVI Exam: Tan-brown and/or pink-flesh-colored symmetric macules and papules  Treatment Plan: Benign appearing on exam today. Recommend observation. Call clinic for new or changing moles. Recommend daily use of broad spectrum spf 30+ sunscreen to sun-exposed  areas.    AK (actinic keratosis) (17) Scalp face and ears x  17  Actinic keratoses are precancerous spots that appear secondary to cumulative UV radiation exposure/sun exposure over time. They are chronic with expected duration over 1 year. A portion of actinic keratoses will progress to squamous cell carcinoma of the skin. It is not possible to reliably predict which spots will progress to skin cancer and so treatment is recommended to prevent development of skin cancer.  Recommend daily broad spectrum sunscreen SPF 30+ to sun-exposed areas, reapply every 2 hours as needed.  Recommend staying in the shade or wearing long sleeves, sun glasses (UVA+UVB protection) and wide brim hats (4-inch brim around the entire circumference of the hat). Call for new or changing lesions.   Destruction of lesion - Scalp face and ears x 17 (17) Complexity: simple   Destruction method: cryotherapy   Informed consent: discussed and consent obtained   Timeout:  patient name, date of birth, surgical site, and procedure verified Lesion destroyed using liquid nitrogen: Yes   Region frozen until ice ball extended beyond lesion: Yes   Outcome: patient tolerated procedure well with no complications   Post-procedure details: wound care instructions given    Viral warts, unspecified type L volar index finger x 1  Viral Wart (HPV) Counseling  Discussed viral / HPV (Human Papilloma Virus) etiology and risk of spread /infectivity to other areas of body as well as to other people.  Multiple treatments and methods may be required to clear warts and it is possible treatment may not be successful.  Treatment risks include discoloration; scarring and there is still potential for wart recurrence.  Destruction of lesion - L volar index finger x 1 Complexity: simple   Destruction method: cryotherapy   Informed consent: discussed and consent obtained   Timeout:  patient name, date of birth, surgical site, and procedure verified Lesion destroyed using liquid nitrogen: Yes   Region  frozen until ice ball extended beyond lesion: Yes   Outcome: patient tolerated procedure well with no complications   Post-procedure details: wound care instructions given    Inflamed seborrheic keratosis R post lat neck x 1  Symptomatic, irritating, patient would like treated.   Destruction of lesion - R post lat neck x 1 Complexity: simple   Destruction method: cryotherapy   Informed consent: discussed and consent obtained   Timeout:  patient name, date of birth, surgical site, and procedure verified Lesion destroyed using liquid nitrogen: Yes   Region frozen until ice ball extended beyond lesion: Yes   Outcome: patient tolerated procedure well with no complications   Post-procedure details: wound care instructions given    EPIDERMAL INCLUSION CYST Exam: Subcutaneous nodule at R post lat neck 0.5 cm  Benign-appearing. Exam most consistent with an epidermal inclusion cyst. Discussed that a cyst is a benign growth that can grow over time and sometimes get irritated or inflamed. Recommend observation if it is not bothersome. Discussed option of surgical excision to remove it if it is growing, symptomatic, or other changes noted. Please call for new or changing lesions so they can be evaluated.  Return for TBSE in 7-8 mths.  Maylene Roes, CMA, am acting as scribe for Armida Sans, MD .   Documentation: I have reviewed the above documentation for accuracy and completeness, and I agree with the above.  Armida Sans, MD

## 2023-07-12 ENCOUNTER — Encounter: Payer: Self-pay | Admitting: Dermatology

## 2024-02-04 ENCOUNTER — Ambulatory Visit: Payer: Medicare Other | Admitting: Dermatology

## 2024-02-05 ENCOUNTER — Ambulatory Visit: Admitting: Dermatology
